# Patient Record
Sex: Male | Born: 1980 | Race: White | Hispanic: No | Marital: Married | Smoking: Never smoker
Health system: Southern US, Community
[De-identification: ages and names within clinical notes are randomized; demographics above are authoritative.]

## PROBLEM LIST (undated history)

## (undated) DIAGNOSIS — E282 Polycystic ovarian syndrome: Secondary | ICD-10-CM

## (undated) DIAGNOSIS — F419 Anxiety disorder, unspecified: Secondary | ICD-10-CM

## (undated) DIAGNOSIS — I4949 Other premature depolarization: Secondary | ICD-10-CM

## (undated) DIAGNOSIS — E039 Hypothyroidism, unspecified: Secondary | ICD-10-CM

## (undated) DIAGNOSIS — G4730 Sleep apnea, unspecified: Secondary | ICD-10-CM

## (undated) DIAGNOSIS — M1990 Unspecified osteoarthritis, unspecified site: Secondary | ICD-10-CM

## (undated) DIAGNOSIS — F32A Depression, unspecified: Secondary | ICD-10-CM

## (undated) DIAGNOSIS — F329 Major depressive disorder, single episode, unspecified: Secondary | ICD-10-CM

## (undated) HISTORY — DX: Other premature depolarization: I49.49

## (undated) HISTORY — DX: Major depressive disorder, single episode, unspecified: F32.9

## (undated) HISTORY — DX: Anxiety disorder, unspecified: F41.9

## (undated) HISTORY — DX: Depression, unspecified: F32.A

## (undated) HISTORY — DX: Polycystic ovarian syndrome: E28.2

## (undated) HISTORY — PX: CHOLECYSTECTOMY: SHX55

## (undated) HISTORY — DX: Unspecified osteoarthritis, unspecified site: M19.90

---

## 2003-09-09 ENCOUNTER — Emergency Department (HOSPITAL_COMMUNITY): Admission: EM | Admit: 2003-09-09 | Discharge: 2003-09-09 | Payer: Self-pay | Admitting: Emergency Medicine

## 2004-05-17 ENCOUNTER — Emergency Department (HOSPITAL_COMMUNITY): Admission: EM | Admit: 2004-05-17 | Discharge: 2004-05-17 | Payer: Self-pay | Admitting: *Deleted

## 2014-09-28 HISTORY — PX: MODIFIED BROSTROM PROCEDURE: SHX2041

## 2015-05-30 LAB — HM PAP SMEAR

## 2016-11-09 ENCOUNTER — Ambulatory Visit (INDEPENDENT_AMBULATORY_CARE_PROVIDER_SITE_OTHER): Payer: Managed Care, Other (non HMO) | Admitting: Family Medicine

## 2016-11-09 ENCOUNTER — Encounter: Payer: Self-pay | Admitting: Family Medicine

## 2016-11-09 VITALS — BP 122/78 | HR 94 | Temp 98.2°F | Resp 16 | Ht 69.0 in | Wt 328.5 lb

## 2016-11-09 DIAGNOSIS — Z0000 Encounter for general adult medical examination without abnormal findings: Secondary | ICD-10-CM | POA: Insufficient documentation

## 2016-11-09 DIAGNOSIS — E282 Polycystic ovarian syndrome: Secondary | ICD-10-CM

## 2016-11-09 DIAGNOSIS — Z23 Encounter for immunization: Secondary | ICD-10-CM | POA: Diagnosis not present

## 2016-11-09 LAB — BASIC METABOLIC PANEL
BUN: 11 mg/dL (ref 6–23)
CHLORIDE: 106 meq/L (ref 96–112)
CO2: 26 meq/L (ref 19–32)
Calcium: 8.7 mg/dL (ref 8.4–10.5)
Creatinine, Ser: 0.7 mg/dL (ref 0.40–1.20)
GFR: 101.54 mL/min (ref >60.00)
Glucose, Bld: 121 mg/dL — ABNORMAL HIGH (ref 70–99)
POTASSIUM: 3.7 meq/L (ref 3.5–5.1)
Sodium: 140 mEq/L (ref 135–145)

## 2016-11-09 LAB — CBC WITH DIFFERENTIAL/PLATELET
BASOS PCT: 0.3 % (ref 0.0–3.0)
Basophils Absolute: 0 10*3/uL (ref 0.0–0.1)
EOS PCT: 1.1 % (ref 0.0–5.0)
Eosinophils Absolute: 0.1 10*3/uL (ref 0.0–0.7)
HEMATOCRIT: 34.7 % — AB (ref 36.0–46.0)
Hemoglobin: 11.8 g/dL — ABNORMAL LOW (ref 12.0–15.0)
LYMPHS ABS: 2.4 10*3/uL (ref 0.7–4.0)
LYMPHS PCT: 26.8 % (ref 12.0–46.0)
MCHC: 33.9 g/dL (ref 30.0–36.0)
MCV: 85.2 fl (ref 78.0–100.0)
MONOS PCT: 6.1 % (ref 3.0–12.0)
Monocytes Absolute: 0.6 10*3/uL (ref 0.1–1.0)
NEUTROS ABS: 6 10*3/uL (ref 1.4–7.7)
Neutrophils Relative %: 65.7 % (ref 43.0–77.0)
PLATELETS: 256 10*3/uL (ref 150.0–400.0)
RBC: 4.07 Mil/uL (ref 3.87–5.11)
RDW: 13.7 % (ref 11.5–15.5)
WBC: 9.1 10*3/uL (ref 4.0–10.5)

## 2016-11-09 LAB — TSH: TSH: 2.58 u[IU]/mL (ref 0.35–4.50)

## 2016-11-09 LAB — VITAMIN D 25 HYDROXY (VIT D DEFICIENCY, FRACTURES): VITD: 10.24 ng/mL — ABNORMAL LOW (ref 30.00–100.00)

## 2016-11-09 LAB — LIPID PANEL
CHOL/HDL RATIO: 3
Cholesterol: 164 mg/dL (ref 0–200)
HDL: 50.3 mg/dL (ref >39.00)
LDL Cholesterol: 90 mg/dL (ref 0–99)
NONHDL: 113.68
TRIGLYCERIDES: 119 mg/dL (ref 0.0–149.0)
VLDL: 23.8 mg/dL (ref 0.0–40.0)

## 2016-11-09 LAB — HEPATIC FUNCTION PANEL
ALK PHOS: 51 U/L (ref 39–117)
ALT: 16 U/L (ref 0–35)
AST: 17 U/L (ref 0–37)
Albumin: 3.8 g/dL (ref 3.5–5.2)
BILIRUBIN TOTAL: 0.3 mg/dL (ref 0.2–1.2)
Bilirubin, Direct: 0.1 mg/dL (ref 0.0–0.3)
Total Protein: 6.7 g/dL (ref 6.0–8.3)

## 2016-11-09 LAB — HEMOGLOBIN A1C: Hgb A1c MFr Bld: 5 % (ref 4.6–6.5)

## 2016-11-09 NOTE — Assessment & Plan Note (Signed)
Pt's PE WNL w/ exception of obesity and thinning hair anteriorly.  Flu shot given today.  Check labs.  Stressed need for healthy diet and regular exercise.  Anticipatory guidance provided.

## 2016-11-09 NOTE — Progress Notes (Signed)
Pre visit review using our clinic review tool, if applicable. No additional management support is needed unless otherwise documented below in the visit note. 

## 2016-11-09 NOTE — Assessment & Plan Note (Signed)
New to provider, ongoing for pt.  Was following w/ GYN [place] '1 large fluid [place]' [place].  Refer back to GYN for ongoing evaluation and tx prn.  Pt expressed understanding and is in agreement w/ plan.

## 2016-11-09 NOTE — Assessment & Plan Note (Signed)
New to provider, ongoing for pt.  Stressed need for healthy diet and regular exercise.  Check labs to risk stratify.  Will follow. 

## 2016-11-09 NOTE — Patient Instructions (Signed)
Follow up in 1 year or as needed We'll notify you of your lab results and make any changes if needed Continue to work on healthy diet and regular exercise- you can do it!! We'll call you with your GYN referral If you do not find a therapist that works for you- let me know! Call with any questions or concerns Welcome!  We're glad to have you!!!
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
## 2016-11-10 ENCOUNTER — Other Ambulatory Visit: Payer: Self-pay | Admitting: General Practice

## 2016-11-10 MED ORDER — VITAMIN D (ERGOCALCIFEROL) 1.25 MG (50000 UNIT) PO CAPS: 50000.0000 [IU] | ORAL_CAPSULE | ORAL | 0 refills | Status: DC

## 2016-11-11 ENCOUNTER — Encounter: Payer: Self-pay | Admitting: Family Medicine

## 2016-11-11 DIAGNOSIS — M25571 Pain in right ankle and joints of right foot: Secondary | ICD-10-CM

## 2016-11-11 DIAGNOSIS — G8929 Other chronic pain: Secondary | ICD-10-CM

## 2016-11-11 DIAGNOSIS — R0683 Snoring: Secondary | ICD-10-CM

## 2016-11-24 ENCOUNTER — Encounter: Payer: Self-pay | Admitting: General Practice

## 2016-12-09 ENCOUNTER — Other Ambulatory Visit: Payer: Self-pay | Admitting: Obstetrics and Gynecology

## 2016-12-09 DIAGNOSIS — R1031 Right lower quadrant pain: Secondary | ICD-10-CM

## 2016-12-11 ENCOUNTER — Other Ambulatory Visit: Payer: Managed Care, Other (non HMO)

## 2016-12-31 ENCOUNTER — Other Ambulatory Visit: Payer: Managed Care, Other (non HMO)

## 2017-01-01 ENCOUNTER — Encounter: Payer: Self-pay | Admitting: Pulmonary Disease

## 2017-01-01 ENCOUNTER — Ambulatory Visit (INDEPENDENT_AMBULATORY_CARE_PROVIDER_SITE_OTHER): Payer: Managed Care, Other (non HMO) | Admitting: Pulmonary Disease

## 2017-01-01 VITALS — BP 108/76 | HR 78 | Ht 69.0 in | Wt 327.2 lb

## 2017-01-01 DIAGNOSIS — G4710 Hypersomnia, unspecified: Secondary | ICD-10-CM | POA: Diagnosis not present

## 2017-01-01 NOTE — Assessment & Plan Note (Signed)
She has snoring, witnessed apneas (worse on back), gasping, choking. Her fiancee has OSA severe and she is on cpap.  She uses [facility].   She sleeps 7 hrs/night. Wakes up very unrefreshed in am. 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  Hypersomnia affects her fxnaility.   Has some sleep talking.  Has frequent awakenings at night. (-) other abn behavior in sleep.   Her parents have OSA.  ESS 13.   Plan : We discussed about the diagnosis of Obstructive Sleep Apnea (OSA) and implications of untreated OSA. We discussed about CPAP and BiPaP as possible treatment options.    We will schedule the patient for a sleep study. Plan for a home sleep study. Likely moderate-severe, worse in supine. If negative study, she is okay with doing a lab study. Anticipate no issues with CPAP. Her fiance has sleep apnea and she has severe and she uses [facility]. Fiancee uses FFM and nasal pillows.    Patient was instructed to call the office if he/she has not heard back from the office 1-2 weeks after the sleep study.   Patient was instructed to call the office if he/she is having issues with the PAP device.   We discussed good sleep hygiene.   Patient was advised not to engage in activities requiring concentration and/or vigilance if he/she is sleepy.  Patient was advised not to drive if he/she is sleepy.

## 2017-01-01 NOTE — Patient Instructions (Signed)
It was a pleasure taking care of you today!  We will schedule you to have a sleep study to determine if you have sleep apnea.   We will get a home sleep test.  You will be instructed to come back to the office to get an apparatus to sleep with overnight.  Once we have the apparatus, it will usually take us 1-2 weeks to read the study and get back at you with results of the test.  Please give us a call in 2 weeks after your study if you do not hear back from us.    If the sleep study is positive, we will order you a CPAP  machine.  Please call the office if you do NOT receive your machine in the next 1-2 weeks.   Please make sure you use your CPAP device everytime you sleep.  We will monitor the usage of your machine per your insurance requirement.  Your insurance company may take the machine from you if you are not using it regularly.   Please clean the mask, tubings, filter, water reservoir with soapy water every week.  Please use distilled water for the water reservoir.   Please call the office or your machine provider (DME company) if you are having issues with the device.   Return to clinic in 8-10 weeks with NP    

## 2017-01-01 NOTE — Progress Notes (Signed)
Subjective:    Patient ID: [name], male    DOB: 1981-04-17, 36 y.o.   MRN: [id number]  HPI   This is the case of Theodore Scott, 36 y.o. Male, who was referred by Dr. Neena Rhymes  in consultation regarding OSA   As you very well know, patient is a non smoker, not known to have asthma or copd. She has snoring, witnessed apneas (worse on back), gasping, choking. Her fiancee has OSA severe and she is on cpap.  She uses [facility].   She sleeps 7 hrs/night. Wakes up very unrefreshed in am. 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  Hypersomnia affects her fxnaility.   Has some sleep talking.  Has frequent awakenings at night. (-) other abn behavior in sleep.   Her parents have OSA.    ESS 13.   Review of Systems  Constitutional: Negative.  Negative for fever and unexpected weight change.  HENT: Negative.  Negative for congestion, dental problem, ear pain, nosebleeds, postnasal drip, rhinorrhea, sinus pressure, sneezing, sore throat and trouble swallowing.   Eyes: Negative.  Negative for redness and itching.  Respiratory: Negative.  Negative for cough, chest tightness, shortness of breath and wheezing.   Cardiovascular: Negative.  Negative for palpitations and leg swelling.  Gastrointestinal: Negative.  Negative for nausea and vomiting.  Endocrine: Negative.   Genitourinary: Negative.  Negative for dysuria.  Musculoskeletal: Negative.  Negative for joint swelling.  Skin: Negative.  Negative for rash.  Allergic/Immunologic: Negative.  Negative for environmental allergies, food allergies and immunocompromised state.  Neurological: Positive for headaches.  Hematological: Negative.  Does not bruise/bleed easily.  Psychiatric/Behavioral: Negative.  Negative for dysphoric mood. The patient is not nervous/anxious.    Past Medical History:  Diagnosis Date  . Anxiety   . Arthritis   . Depression   . Ectopic heartbeat   . PCOS (polycystic ovarian  syndrome)    (-) CA, DVT  Family History  Problem Relation Age of Onset  . Hypertension Mother   . Atrial fibrillation Mother   . Depression Mother   . Anxiety disorder Mother   . Thyroid disease Mother   . Sleep apnea Mother   . Hyperlipidemia Father   . Hypertension Father   . Depression Father   . Anxiety disorder Father   . Diabetes Father   . Sleep apnea Father   . Bipolar disorder Sister   . Anxiety disorder Sister   . Thyroid disease Maternal Aunt   . Diabetes Paternal Aunt   . Diabetes Paternal Uncle   . Heart attack Paternal Uncle   . Lupus Maternal Grandmother   . Thyroid disease Maternal Grandmother   . Cancer Paternal Grandmother     breast  . Diabetes Paternal Grandmother   . Diabetes Paternal Grandfather      Past Surgical History:  Procedure Laterality Date  . CHOLECYSTECTOMY    . MODIFIED BROSTROM PROCEDURE Right 2016    Social History   Social History  . Marital status: Single    Spouse name: N/A  . Number of children: N/A  . Years of education: N/A   Occupational History  . Not on file.   Social History Main Topics  . Smoking status: Never Smoker  . Smokeless tobacco: Never Used  . Alcohol use Yes  . Drug use: No  . Sexual activity: Yes   Other Topics Concern  . Not on file   Social History Narrative  . No narrative  on file     Allergies  Allergen Reactions  . Vicodin [Hydrocodone-Acetaminophen] Other (See Comments)    Pt does not like how she feels "shaky, panicky" sensation     Outpatient Medications Prior to Visit  Medication Sig Dispense Refill  . Vitamin D, Ergocalciferol, (DRISDOL) 50000 units CAPS capsule Take 1 capsule (50,000 Units total) by mouth every 7 (seven) days. 12 capsule 0   No facility-administered medications prior to visit.    No orders of the defined types were placed in this encounter.          Objective:   Physical Exam   Vitals:  Vitals:   01/01/17 1023  BP: 108/76  Pulse: 78  SpO2:  97%  Weight: (!) 327 lb 3.2 oz (148.4 kg)  Height:  (1.753 m)    Constitutional/General:  Pleasant, well-nourished, well-developed, not in any distress,  Comfortably seating.  Well kempt  Body mass index is 48.32 kg/m. Wt Readings from Last 3 Encounters:  01/01/17 (!) 327 lb 3.2 oz (148.4 kg)  11/09/16 (!) 328 lb 8 oz (149 kg)    Neck circumference:   HEENT: Pupils equal and reactive to light and accommodation. Anicteric sclerae. Normal nasal mucosa.   No oral  lesions,  mouth clear,  oropharynx clear, no postnasal drip. (-) Oral thrush. No dental caries.  Airway - Mallampati class III  Neck: No masses. Midline trachea. No JVD, (-) LAD. (-) bruits appreciated.  Respiratory/Chest: Grossly normal chest. (-) deformity. (-) Accessory muscle use.  Symmetric expansion. (-) Tenderness on palpation.  Resonant on percussion.  Diminished BS on both lower lung zones. (-) wheezing, crackles, rhonchi (-) egophony  Cardiovascular: Regular rate and  rhythm, heart sounds normal, no murmur or gallops, no peripheral edema  Gastrointestinal:  Normal bowel sounds. Soft, non-tender. No hepatosplenomegaly.  (-) masses.   Musculoskeletal:  Normal muscle tone. Normal gait.   Extremities: Grossly normal. (-) clubbing, cyanosis.  (-) edema  Skin: (-) rash,lesions seen.   Neurological/Psychiatric : alert, oriented to time, place, person. Normal mood and affect         Assessment & Plan:  Hypersomnia She has snoring, witnessed apneas (worse on back), gasping, choking. Her fiancee has OSA severe and she is on cpap.  She uses [facility].   She sleeps 7 hrs/night. Wakes up very unrefreshed in am. 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  Hypersomnia affects her fxnaility.   Has some sleep talking.  Has frequent awakenings at night. (-) other abn behavior in sleep.   Her parents have OSA.  ESS 13.   Plan : We discussed about the diagnosis of Obstructive Sleep Apnea  (OSA) and implications of untreated OSA. We discussed about CPAP and BiPaP as possible treatment options.    We will schedule the patient for a sleep study. Plan for a home sleep study. Likely moderate-severe, worse in supine. If negative study, she is okay with doing a lab study. Anticipate no issues with CPAP. Her fiance has sleep apnea and she has severe and she uses [facility]. Fiancee uses FFM and nasal pillows.    Patient was instructed to call the office if he/she has not heard back from the office 1-2 weeks after the sleep study.   Patient was instructed to call the office if he/she is having issues with the PAP device.   We discussed good sleep hygiene.   Patient was advised not to engage in activities requiring concentration and/or vigilance if he/she  is sleepy.  Patient was advised not to drive if he/she is sleepy.      Thank you very much for letting me participate in this patient's care. Please do not hesitate to give me a call if you have any questions or concerns regarding the treatment plan.   Patient will follow up in 8 weeks.     Pollie Meyer, MD 01/01/2017   11:04 AM Pulmonary and Critical Care Medicine [facility] Pager: [phone] Office: [phone], Fax: [phone]

## 2017-01-20 ENCOUNTER — Telehealth: Payer: Self-pay | Admitting: Pulmonary Disease

## 2017-01-20 NOTE — Telephone Encounter (Signed)
Pt seen for sleep consult 4.6.18 by AD: Patient Instructions  It was a pleasure taking care of you today!   We will schedule you to have a sleep study to determine if you have sleep apnea.    We will get a home sleep test.  You will be instructed to come back to the office to get an apparatus to sleep with overnight.  Once we have the apparatus, it will usually take [place] 1-2 weeks to read the study and get back at you with results of the test.  Please give [place] a call in 2 weeks after your study if you do not hear back from [place].    If the sleep study is positive, we will order you a CPAP  machine.  Please call the office if you do NOT receive your machine in the next 1-2 weeks.    Please make sure you use your CPAP device everytime you sleep.  We will monitor the usage of your machine per your insurance requirement.  Your insurance company may take the machine from you if you are not using it regularly.    Please clean the mask, tubings, filter, water reservoir with soapy water every week.  Please use distilled water for the water reservoir.    Please call the office or your machine provider (DME company) if you are having issues with the device.    Return to clinic in 8-10 weeks with NP    Called spoke with patient who reported that she was inctructed to call the office in 2 weeks if she had not heard anything regarding her HST.  Apologized to pt for the delay, advised there is a several week wait on the HST but will route message to PCCs to provide a more definite time-frame.  Pt okay with this and voiced her understanding.  PCC's please advise, thank you

## 2017-01-21 NOTE — Telephone Encounter (Signed)
I spoke with Theodore Scott and she is coming to pick up the HST machine on 02/05/2017

## 2017-02-06 DIAGNOSIS — G4733 Obstructive sleep apnea (adult) (pediatric): Secondary | ICD-10-CM | POA: Diagnosis not present

## 2017-02-12 ENCOUNTER — Telehealth: Payer: Self-pay | Admitting: Pulmonary Disease

## 2017-02-12 DIAGNOSIS — G4733 Obstructive sleep apnea (adult) (pediatric): Secondary | ICD-10-CM

## 2017-02-12 NOTE — Telephone Encounter (Signed)
  Please call the pt and tell the pt the HOME SLEEP STUDY  showed OSA  Pt stops breathing 23   times an hour.   Home sleep study was done on : 02/06/17  Please order autoCPAP 5-15 cm H2O. Patient will need a mask fitting session. Patient will need a 1 month download.   Patient needs to be seen by me or any of the NPs/APPs  4-6 weeks after obtaining the cpap machine. Let me know if you receive this.   Thanks!   J. Alexis FrockAngelo A de Dios, MD 02/12/2017, 1:49 PM

## 2017-02-15 ENCOUNTER — Other Ambulatory Visit: Payer: Self-pay | Admitting: *Deleted

## 2017-02-15 DIAGNOSIS — G4710 Hypersomnia, unspecified: Secondary | ICD-10-CM

## 2017-02-16 NOTE — Telephone Encounter (Signed)
Spoke with pt and made her aware of her results per AD. She understood and agreed to the order being placed for the cpap machine. She decided to wait until she obtains the machine for a follow up appt. She had no further questions. Nothing further is needed

## 2017-02-25 ENCOUNTER — Telehealth: Payer: Self-pay | Admitting: Pulmonary Disease

## 2017-02-25 NOTE — Telephone Encounter (Signed)
Spoke with pt, wishes to switch order for cpap machine from [facility] to [facility].  Pt states her partner uses [facility] and would prefer to have just 1 DME company to work with.  Pt has not yet received equipment from [facility]- order placed on 02/16/2017.  PCC's please advise if order can be sent to [facility] instead of [facility].  Thanks!

## 2017-02-25 NOTE — Telephone Encounter (Signed)
Cancelled [facility] order and resent order to [facility] Theodore Scott

## 2017-02-26 ENCOUNTER — Ambulatory Visit: Payer: Managed Care, Other (non HMO) | Admitting: Adult Health

## 2017-04-07 ENCOUNTER — Ambulatory Visit
Admission: RE | Admit: 2017-04-07 | Discharge: 2017-04-07 | Disposition: A | Discharge status: Home / Self Care | Payer: Managed Care, Other (non HMO) | Source: Ambulatory Visit | Attending: Obstetrics and Gynecology | Admitting: Obstetrics and Gynecology

## 2017-04-07 DIAGNOSIS — R1031 Right lower quadrant pain: Secondary | ICD-10-CM

## 2017-04-13 ENCOUNTER — Encounter: Payer: Self-pay | Admitting: Sports Medicine

## 2017-04-13 ENCOUNTER — Ambulatory Visit (INDEPENDENT_AMBULATORY_CARE_PROVIDER_SITE_OTHER): Payer: Managed Care, Other (non HMO)

## 2017-04-13 ENCOUNTER — Ambulatory Visit (INDEPENDENT_AMBULATORY_CARE_PROVIDER_SITE_OTHER): Payer: Managed Care, Other (non HMO) | Admitting: Sports Medicine

## 2017-04-13 DIAGNOSIS — G8929 Other chronic pain: Secondary | ICD-10-CM

## 2017-04-13 DIAGNOSIS — M79671 Pain in right foot: Secondary | ICD-10-CM

## 2017-04-13 DIAGNOSIS — M7671 Peroneal tendinitis, right leg: Secondary | ICD-10-CM

## 2017-04-13 DIAGNOSIS — M779 Enthesopathy, unspecified: Secondary | ICD-10-CM

## 2017-04-13 DIAGNOSIS — M778 Other enthesopathies, not elsewhere classified: Secondary | ICD-10-CM

## 2017-04-13 DIAGNOSIS — M7751 Other enthesopathy of right foot: Secondary | ICD-10-CM

## 2017-04-13 DIAGNOSIS — M25571 Pain in right ankle and joints of right foot: Secondary | ICD-10-CM

## 2017-04-13 MED ORDER — TRIAMCINOLONE ACETONIDE 10 MG/ML IJ SUSP: 10.0000 mg | Freq: Once | INTRAMUSCULAR | Status: DC

## 2017-04-13 NOTE — Progress Notes (Signed)
Subjective: Theodore Scott is a 36 y.o. male patient who presents to office for evaluation of right foot/ankle pain. Patient complains of continued pain in the foot. Patient has tried rest with no complete relief in symptoms. Patient denies any other pedal complaints. Denies recent injury/trip/fall/sprain/any causative factors. History of surgery 11/2014 with tendon repair and since has no re-sprain. Patient states pain is constant in the foot but off and on at the ankle. Patient denies any other issues.   Patient Active Problem List   Diagnosis Date Noted  . Hypersomnia 01/01/2017  . Morbid obesity ([place]) 11/09/2016  . PCOS (polycystic ovarian syndrome) 11/09/2016  . Physical exam 11/09/2016    Current Outpatient Prescriptions on File Prior to Visit  Medication Sig Dispense Refill  . Vitamin D, Ergocalciferol, (DRISDOL) 50000 units CAPS capsule Take 1 capsule (50,000 Units total) by mouth every 7 (seven) days. (Patient not taking: Reported on 04/13/2017) 12 capsule 0   No current facility-administered medications on file prior to visit.     Allergies  Allergen Reactions  . Vicodin [Hydrocodone-Acetaminophen] Other (See Comments)    Pt does not like how she feels "shaky, panicky" sensation    Objective:  General: Alert and oriented x3 in no acute distress  Dermatology: Old surgical scar at right lateral ankle, No open lesions bilateral lower extremities, no webspace macerations, no ecchymosis bilateral, all nails x 10 are well manicured.  Vascular: Dorsalis Pedis and Posterior Tibial pedal pulses palpable, Capillary Fill Time 3 seconds,(+) pedal hair growth bilateral, trace edema at right dorsolateral foot, Temperature gradient within normal limits.  Neurology: Johney Maine sensation intact via light touch bilateral. (- )Tinels sign bilateral.   Musculoskeletal: Mild tenderness with palpation at right 5th met base at peroneal insertion, Negative talar tilt, Negative tib-fib stress, No  instability. No pain with calf compression bilateral. Range of motion within normal limits with mild guarding on right ankle. Strength within normal limits in all groups bilateral.   Gait: Antalgic gait  Xrays  Right foot and Ankle   Impression: Mild lateral ankle and midfoot arthritis with no acute fracture or dislocation, swelling at lateral foot and ankle.    Assessment and Plan: Problem List Items Addressed This Visit    None    Visit Diagnoses    Peroneal tendonitis, right    -  Primary   Relevant Medications   triamcinolone acetonide (KENALOG) 10 MG/ML injection 10 mg (Start on 04/13/2017  9:15 PM)   Capsulitis of foot, right       Relevant Medications   triamcinolone acetonide (KENALOG) 10 MG/ML injection 10 mg (Start on 04/13/2017  9:15 PM)   Chronic pain of right ankle       Relevant Orders   DG Ankle Complete Right (Completed)   Chronic pain in right foot       Relevant Medications   triamcinolone acetonide (KENALOG) 10 MG/ML injection 10 mg (Start on 04/13/2017  9:15 PM)   Other Relevant Orders   DG Foot 2 Views Right (Completed)       -Complete examination performed -Xrays reviewed -Discussed treatement options for tendonitis  -After oral consent and aseptic prep, injected a mixture containing 1 ml of 2%  plain lidocaine, 1 ml 0.5% plain marcaine, 0.5 ml of kenalog 10 and 0.5 ml of dexamethasone phosphate into right peroneal insertion without complication. Post-injection care discussed with patient.  -Recommend patient to return to brace that she already has.  -Patient to return to office in 3-4 weeks or sooner  if condition worsens.  Landis Martins, DPM

## 2017-04-13 NOTE — Progress Notes (Signed)
   Subjective:    Patient ID: Theodore Scott, male    DOB: [birth year], 36 y.o.   MRN: [id number]  HPI I have had right ankle pain for years.  Years of repeated injuries that lead to corrective surgery in 2016 I have had pain and swelling ever since.     Review of Systems  All other systems reviewed and are negative.      Objective:   Physical Exam        Assessment & Plan:

## 2017-04-15 ENCOUNTER — Telehealth: Payer: Self-pay | Admitting: *Deleted

## 2017-04-15 MED ORDER — MELOXICAM 15 MG PO TABS: 15.0000 mg | ORAL_TABLET | Freq: Every day | ORAL | 0 refills | Status: DC

## 2017-04-15 NOTE — Telephone Encounter (Signed)
Emailed Dr. Wynema BirchStover's response information to pt and ordered mobic.

## 2017-04-22 ENCOUNTER — Ambulatory Visit: Payer: Managed Care, Other (non HMO) | Admitting: Acute Care

## 2017-05-11 ENCOUNTER — Ambulatory Visit: Payer: Managed Care, Other (non HMO) | Admitting: Sports Medicine

## 2018-01-31 ENCOUNTER — Other Ambulatory Visit: Payer: Self-pay

## 2018-01-31 ENCOUNTER — Encounter: Payer: Self-pay | Admitting: Family Medicine

## 2018-01-31 ENCOUNTER — Encounter

## 2018-01-31 ENCOUNTER — Ambulatory Visit (INDEPENDENT_AMBULATORY_CARE_PROVIDER_SITE_OTHER): Payer: No Typology Code available for payment source | Admitting: Family Medicine

## 2018-01-31 VITALS — BP 118/78 | HR 67 | Temp 98.1°F | Resp 16 | Ht 69.0 in | Wt 324.4 lb

## 2018-01-31 DIAGNOSIS — K219 Gastro-esophageal reflux disease without esophagitis: Secondary | ICD-10-CM

## 2018-01-31 DIAGNOSIS — Z0000 Encounter for general adult medical examination without abnormal findings: Secondary | ICD-10-CM | POA: Diagnosis not present

## 2018-01-31 LAB — BASIC METABOLIC PANEL
BUN: 15 mg/dL (ref 6–23)
CHLORIDE: 104 meq/L (ref 96–112)
CO2: 27 meq/L (ref 19–32)
CREATININE: 0.67 mg/dL (ref 0.40–1.20)
Calcium: 8.9 mg/dL (ref 8.4–10.5)
GFR: 106.04 mL/min (ref >60.00)
Glucose, Bld: 97 mg/dL (ref 70–99)
Potassium: 4.3 mEq/L (ref 3.5–5.1)
Sodium: 139 mEq/L (ref 135–145)

## 2018-01-31 LAB — HEPATIC FUNCTION PANEL
ALBUMIN: 3.6 g/dL (ref 3.5–5.2)
ALT: 15 U/L (ref 0–35)
AST: 13 U/L (ref 0–37)
Alkaline Phosphatase: 46 U/L (ref 39–117)
Bilirubin, Direct: 0.1 mg/dL (ref 0.0–0.3)
Total Bilirubin: 0.3 mg/dL (ref 0.2–1.2)
Total Protein: 6.4 g/dL (ref 6.0–8.3)

## 2018-01-31 LAB — CBC WITH DIFFERENTIAL/PLATELET
BASOS ABS: 0 10*3/uL (ref 0.0–0.1)
Basophils Relative: 0.6 % (ref 0.0–3.0)
Eosinophils Absolute: 0.1 10*3/uL (ref 0.0–0.7)
Eosinophils Relative: 1.8 % (ref 0.0–5.0)
HCT: 33.3 % — ABNORMAL LOW (ref 36.0–46.0)
HEMOGLOBIN: 11.1 g/dL — AB (ref 12.0–15.0)
LYMPHS ABS: 2 10*3/uL (ref 0.7–4.0)
Lymphocytes Relative: 28.2 % (ref 12.0–46.0)
MCHC: 33.4 g/dL (ref 30.0–36.0)
MCV: 84.4 fl (ref 78.0–100.0)
Monocytes Absolute: 0.5 10*3/uL (ref 0.1–1.0)
Monocytes Relative: 7.7 % (ref 3.0–12.0)
NEUTROS PCT: 61.7 % (ref 43.0–77.0)
Neutro Abs: 4.3 10*3/uL (ref 1.4–7.7)
Platelets: 233 10*3/uL (ref 150.0–400.0)
RBC: 3.95 Mil/uL (ref 3.87–5.11)
RDW: 14.4 % (ref 11.5–15.5)
WBC: 7 10*3/uL (ref 4.0–10.5)

## 2018-01-31 LAB — HEMOGLOBIN A1C: Hgb A1c MFr Bld: 5.1 % (ref 4.6–6.5)

## 2018-01-31 LAB — LIPID PANEL
CHOL/HDL RATIO: 3
Cholesterol: 126 mg/dL (ref 0–200)
HDL: 37.6 mg/dL — AB (ref >39.00)
LDL Cholesterol: 73 mg/dL (ref 0–99)
NonHDL: 88.75
TRIGLYCERIDES: 77 mg/dL (ref 0.0–149.0)
VLDL: 15.4 mg/dL (ref 0.0–40.0)

## 2018-01-31 LAB — TSH: TSH: 5.09 u[IU]/mL — ABNORMAL HIGH (ref 0.35–4.50)

## 2018-01-31 LAB — VITAMIN D 25 HYDROXY (VIT D DEFICIENCY, FRACTURES): VITD: 20.47 ng/mL — AB (ref 30.00–100.00)

## 2018-01-31 MED ORDER — RANITIDINE HCL 300 MG PO TABS: 300.0000 mg | ORAL_TABLET | Freq: Every day | ORAL | 1 refills | Status: DC

## 2018-01-31 MED ORDER — METFORMIN HCL ER 500 MG PO TB24: 500.0000 mg | ORAL_TABLET | Freq: Every day | ORAL | 1 refills | Status: DC

## 2018-01-31 NOTE — Patient Instructions (Signed)
Follow up in 6 months to recheck weight loss progress We'll notify you of your lab results and make any changes if needed Continue to work on healthy diet and regular exercise- you can do it! Restart the Metformin once daily- w/ food- to help w/ PCOS START the Ranitidine nightly for reflux Take the Meloxicam as needed for pain/inflammation Start OTC Compound W for wart treatment Call with any questions or concerns [place]!!!

## 2018-01-31 NOTE — Assessment & Plan Note (Signed)
Ongoing issue for pt.  BMI is 47.9  She gave up soda in January and has a more physically active job.  Applauded these changes and encouraged her to continue healthy diet and regular activity.  Check labs to risk stratify.  Will follow.

## 2018-01-31 NOTE — Progress Notes (Signed)
   Subjective:    Patient ID: Theodore Scott, male    DOB: [birth year], 37 y.o.   MRN: [id number]  HPI CPE- UTD on pap.  UTD on immunizations.   Review of Systems Patient reports no vision/ hearing changes, adenopathy,fever, weight change,  persistant/recurrent hoarseness , swallowing issues, chest pain, palpitations, edema, persistant/recurrent cough, hemoptysis, dyspnea (rest/exertional/paroxysmal nocturnal), gastrointestinal bleeding (melena, rectal bleeding), abdominal pain, bowel changes, GU symptoms (dysuria, hematuria, incontinence), Gyn symptoms (abnormal  bleeding, pain),  syncope, focal weakness, memory loss, numbness & tingling, hair/nail changes, abnormal bruising or bleeding, anxiety, or depression.  + GERD- sxs are particularly bad at night Joint pains- pt hoping to restart Meloxicam  + finger wart- has not tried anything OTC     Objective:   Physical Exam General Appearance:    Alert, cooperative, no distress, appears stated age, obese  Head:    Normocephalic, without obvious abnormality, atraumatic  Eyes:    PERRL, conjunctiva/corneas clear, EOM's intact, fundi    benign, both eyes  Ears:    Normal TM's and external ear canals, both ears  Nose:   Nares normal, septum midline, mucosa normal, no drainage    or sinus tenderness  Throat:   Lips, mucosa, and tongue normal; teeth and gums normal  Neck:   Supple, symmetrical, trachea midline, no adenopathy;    Thyroid: no enlargement/tenderness/nodules  Back:     Symmetric, no curvature, ROM normal, no CVA tenderness  Lungs:     Clear to auscultation bilaterally, respirations unlabored  Chest Wall:    No tenderness or deformity   Heart:    Regular rate and rhythm, S1 and S2 normal, no murmur, rub   or gallop  Breast Exam:    Deferred to GYN  Abdomen:     Soft, non-tender, bowel sounds active all four quadrants,    no masses, no organomegaly  Genitalia:    Deferred to GYN  Rectal:    Extremities:   Extremities normal,  atraumatic, no cyanosis or edema  Pulses:   2+ and symmetric all extremities  Skin:   Skin color, texture, turgor normal, no rashes or lesions  Lymph nodes:   Cervical, supraclavicular, and axillary nodes normal  Neurologic:   CNII-XII intact, normal strength, sensation and reflexes    throughout          Assessment & Plan:

## 2018-01-31 NOTE — Assessment & Plan Note (Addendum)
Pt's PE WNL w/ exception of obesity.  Has GYN.  UTD on immunizations.  Stressed need for healthy diet and regular exercise.  Check labs.  Anticipatory guidance provided.

## 2018-01-31 NOTE — Assessment & Plan Note (Signed)
New.  Pt's sxs are worse at night.  Start Ranitidine.  Reviewed dietary and lifestyle modifications to improve sxs.  Will follow.

## 2018-02-01 ENCOUNTER — Other Ambulatory Visit: Payer: Self-pay | Admitting: General Practice

## 2018-02-01 ENCOUNTER — Encounter: Payer: Self-pay | Admitting: Family Medicine

## 2018-02-01 DIAGNOSIS — E039 Hypothyroidism, unspecified: Secondary | ICD-10-CM

## 2018-02-01 MED ORDER — VITAMIN D (ERGOCALCIFEROL) 1.25 MG (50000 UNIT) PO CAPS: 50000.0000 [IU] | ORAL_CAPSULE | ORAL | 0 refills | Status: DC

## 2018-02-01 MED ORDER — LEVOTHYROXINE SODIUM 75 MCG PO TABS: 75.0000 ug | ORAL_TABLET | Freq: Every day | ORAL | 3 refills | Status: DC

## 2018-02-02 ENCOUNTER — Other Ambulatory Visit: Payer: Self-pay | Admitting: General Practice

## 2018-02-02 ENCOUNTER — Encounter: Payer: Self-pay | Admitting: Family Medicine

## 2018-02-02 MED ORDER — MELOXICAM 15 MG PO TABS: 15.0000 mg | ORAL_TABLET | Freq: Every day | ORAL | 1 refills | Status: DC

## 2018-02-23 ENCOUNTER — Telehealth: Payer: Self-pay | Admitting: Family Medicine

## 2018-02-23 NOTE — Telephone Encounter (Signed)
Copied from CRM [id number]. Topic: Quick Communication - See Telephone Encounter >> Feb 23, 2018  3:52 PM Louie Bun, Rosey Bath D wrote: CRM for notification. See Telephone encounter for: 02/23/18. 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 She is due for June 9-10, 2019. If there are any question please call patient back.

## 2018-02-23 NOTE — Telephone Encounter (Signed)
They should be able to see and release

## 2018-02-23 NOTE — Telephone Encounter (Signed)
TSH has already been ordered as a future lab. They should be able to release correct?

## 2018-02-24 NOTE — Telephone Encounter (Signed)
Noted  

## 2018-03-01 ENCOUNTER — Other Ambulatory Visit
Admission: RE | Admit: 2018-03-01 | Discharge: 2018-03-01 | Disposition: A | Discharge status: Home / Self Care | Payer: No Typology Code available for payment source | Source: Ambulatory Visit | Attending: Family Medicine | Admitting: Family Medicine

## 2018-03-01 DIAGNOSIS — E039 Hypothyroidism, unspecified: Secondary | ICD-10-CM | POA: Insufficient documentation

## 2018-03-01 LAB — TSH: TSH: 1.398 u[IU]/mL (ref 0.350–4.500)

## 2018-03-02 ENCOUNTER — Encounter: Payer: Self-pay | Admitting: General Practice

## 2018-05-03 LAB — HM PAP SMEAR

## 2018-06-14 ENCOUNTER — Other Ambulatory Visit: Payer: Self-pay | Admitting: Family Medicine

## 2018-06-29 ENCOUNTER — Ambulatory Visit (HOSPITAL_COMMUNITY): Payer: No Typology Code available for payment source | Admitting: Licensed Clinical Social Worker

## 2018-08-01 ENCOUNTER — Ambulatory Visit: Payer: No Typology Code available for payment source | Admitting: Family Medicine

## 2018-08-03 ENCOUNTER — Encounter: Payer: Self-pay | Admitting: Family Medicine

## 2018-08-03 ENCOUNTER — Ambulatory Visit (INDEPENDENT_AMBULATORY_CARE_PROVIDER_SITE_OTHER): Payer: No Typology Code available for payment source | Admitting: Family Medicine

## 2018-08-03 ENCOUNTER — Other Ambulatory Visit: Payer: Self-pay

## 2018-08-03 NOTE — Assessment & Plan Note (Signed)
Ongoing issue for pt.  She is down 43 lbs since last visit.  She is feeling great.  Eating better, exercising regularly.  Applauded her efforts.  No need for repeat labs at this time.  Will follow.

## 2018-08-03 NOTE — Progress Notes (Signed)
   Subjective:    Patient ID: Gearldine Bienenstock, male    DOB: 02/02/1981, 37 y.o.   MRN: [id number]  HPI Obesity- pt has lost 43 lbs since last visit!  Pt reports feeling 'a lot better'.  Diet has changed 'drastically'.  Job is now more active, walking at home.  No CP, SOB, HAs, visual changes, abd pain, N/V.   Review of Systems For ROS see HPI     Objective:   Physical Exam  Constitutional: She is oriented to person, place, and time. She appears well-developed and well-nourished. No distress.  obese  HENT:  Head: Normocephalic and atraumatic.  Eyes: Pupils are equal, round, and reactive to light. Conjunctivae and EOM are normal.  Neck: Normal range of motion. Neck supple. No thyromegaly present.  Cardiovascular: Normal rate, regular rhythm, normal heart sounds and intact distal pulses.  No murmur heard. Pulmonary/Chest: Effort normal and breath sounds normal. No respiratory distress.  Abdominal: Soft. She exhibits no distension. There is no tenderness.  Musculoskeletal: She exhibits no edema.  Lymphadenopathy:    She has no cervical adenopathy.  Neurological: She is alert and oriented to person, place, and time.  Skin: Skin is warm and dry.  Psychiatric: She has a normal mood and affect. Her behavior is normal.  Vitals reviewed.         Assessment & Plan:

## 2018-08-03 NOTE — Patient Instructions (Signed)
Schedule your complete physical in 6 months Keep up the good work on healthy diet and regular exercise- you're doing fantastic!!! Call with any questions or concerns Happy Holidays!!!

## 2018-08-05 ENCOUNTER — Encounter: Payer: Self-pay | Admitting: Family Medicine

## 2018-08-05 ENCOUNTER — Encounter: Payer: Self-pay | Admitting: General Practice

## 2018-08-05 ENCOUNTER — Ambulatory Visit (INDEPENDENT_AMBULATORY_CARE_PROVIDER_SITE_OTHER): Payer: No Typology Code available for payment source | Admitting: Family Medicine

## 2018-08-05 ENCOUNTER — Other Ambulatory Visit: Payer: Self-pay

## 2018-08-05 VITALS — BP 120/80 | HR 72 | Temp 98.1°F | Resp 16 | Ht 69.0 in | Wt 281.0 lb

## 2018-08-05 DIAGNOSIS — B078 Other viral warts: Secondary | ICD-10-CM

## 2018-08-05 NOTE — Patient Instructions (Signed)
Follow up as needed or as scheduled Apply antibiotic ointment to the surrounding skin You can apply compound W to the area we froze Keep area covered if possible Call with any questions or concerns Have a great weekend!

## 2018-08-05 NOTE — Progress Notes (Signed)
   Subjective:    Patient ID: Theodore Scott, male    DOB: Nov 03, 1980, 37 y.o.   MRN: [id number]  HPI R 4th finger wart- pt desires removal of wart w/ cryo therapy.  Reviewed procedure and possible complications as well as follow up care w/ pt.   Review of Systems For ROS see HPI     Objective:   Physical Exam  Constitutional: She is oriented to person, place, and time. She appears well-developed and well-nourished.  Neurological: She is alert and oriented to person, place, and time.  Skin: Skin is warm and dry.  1 cm wart on R 4th finger  Psychiatric: She has a normal mood and affect. Her behavior is normal. Thought content normal.  Vitals reviewed.         Assessment & Plan:  Wart- pt tolerated 3 cycles of freeze/thaw using liquid nitrogen.  Area dressed w/ Bacitracin and covered w/ band aid.  Instructions given.

## 2018-08-22 ENCOUNTER — Other Ambulatory Visit: Payer: Self-pay | Admitting: Family Medicine

## 2018-10-21 ENCOUNTER — Other Ambulatory Visit: Payer: Self-pay | Admitting: Family Medicine

## 2018-11-18 DIAGNOSIS — T7492XA Unspecified child maltreatment, confirmed, initial encounter: Secondary | ICD-10-CM | POA: Insufficient documentation

## 2018-12-19 ENCOUNTER — Other Ambulatory Visit: Payer: Self-pay | Admitting: Family Medicine

## 2018-12-22 ENCOUNTER — Other Ambulatory Visit: Payer: Self-pay | Admitting: Family Medicine

## 2018-12-23 ENCOUNTER — Encounter: Payer: Self-pay | Admitting: Family Medicine

## 2018-12-23 ENCOUNTER — Other Ambulatory Visit: Payer: Self-pay | Admitting: *Deleted

## 2018-12-23 ENCOUNTER — Other Ambulatory Visit: Payer: Self-pay | Admitting: Emergency Medicine

## 2018-12-23 MED ORDER — LEVOTHYROXINE SODIUM 75 MCG PO TABS: 75.0000 ug | ORAL_TABLET | Freq: Every day | ORAL | 1 refills | Status: DC

## 2018-12-23 MED ORDER — MELOXICAM 15 MG PO TABS: 15.0000 mg | ORAL_TABLET | Freq: Every day | ORAL | 1 refills | Status: DC

## 2019-01-23 ENCOUNTER — Encounter: Payer: Self-pay | Admitting: Family Medicine

## 2019-01-24 MED ORDER — LEVOTHYROXINE SODIUM 75 MCG PO TABS: 75.0000 ug | ORAL_TABLET | Freq: Every day | ORAL | 1 refills | Status: DC

## 2019-02-16 ENCOUNTER — Other Ambulatory Visit: Payer: Self-pay

## 2019-02-16 ENCOUNTER — Ambulatory Visit (INDEPENDENT_AMBULATORY_CARE_PROVIDER_SITE_OTHER): Payer: No Typology Code available for payment source | Admitting: Family Medicine

## 2019-02-16 ENCOUNTER — Encounter: Payer: No Typology Code available for payment source | Admitting: Family Medicine

## 2019-02-16 ENCOUNTER — Encounter: Payer: Self-pay | Admitting: Family Medicine

## 2019-02-16 VITALS — BP 121/81 | HR 64 | Temp 97.9°F | Resp 17 | Wt 254.5 lb

## 2019-02-16 DIAGNOSIS — R350 Frequency of micturition: Secondary | ICD-10-CM | POA: Diagnosis not present

## 2019-02-16 DIAGNOSIS — M79673 Pain in unspecified foot: Secondary | ICD-10-CM

## 2019-02-16 DIAGNOSIS — G8929 Other chronic pain: Secondary | ICD-10-CM | POA: Diagnosis not present

## 2019-02-16 LAB — POCT URINALYSIS DIPSTICK
Bilirubin, UA: NEGATIVE
Blood, UA: NEGATIVE
Glucose, UA: NEGATIVE
Ketones, UA: NEGATIVE
Leukocytes, UA: NEGATIVE
Nitrite, UA: NEGATIVE
Protein, UA: NEGATIVE
Spec Grav, UA: >=1.03 — AB (ref 1.010–1.025)
Urobilinogen, UA: 0.2 E.U./dL
pH, UA: 6 (ref 5.0–8.0)

## 2019-02-16 NOTE — Patient Instructions (Signed)
Follow up as needed or as scheduled STOP the Meloxicam USE ES Tylenol as needed for foot pain Try and change positions prior to exiting bathroom IF no improvement, let me know and we can refer if needed Call with any questions or concerns GOOD LUCK!!!

## 2019-02-16 NOTE — Progress Notes (Signed)
   Subjective:    Patient ID: Theodore Scott, male    DOB: 1981/01/12, 38 y.o.   MRN: [id number]  HPI Urinary urgency- 'I think it's been going on my whole life'.  Pt reports going to the bathroom and 'I pee multiple times when i'm in there'.  Will then stand and feel a 'sudden, urgent urge to go the bathroom again and it's just a trickle'.  When she wakes in the AM she is not just able to go the bathroom, 'I have to squeeze it out'.  No dysuria.  No odor.  No blood.  'it's become very annoying lately'.  Chronic foot pain- was taking Meloxicam daily, stopped yesterday.  Is considering pregnancy.     Review of Systems For ROS see HPI     Objective:   Physical Exam Vitals signs reviewed.  Constitutional:      General: She is not in acute distress.    Appearance: Normal appearance. She is obese. She is not ill-appearing.  HENT:     Head: Normocephalic and atraumatic.  Skin:    General: Skin is warm and dry.  Neurological:     General: No focal deficit present.     Mental Status: She is alert and oriented to person, place, and time.  Psychiatric:        Mood and Affect: Mood normal.        Behavior: Behavior normal.        Thought Content: Thought content normal.           Assessment & Plan:  Urinary urgency- new to provider, ongoing for pt.  Discussed the mechanics of bladder position and urethra angles as I suspect this is the cause of pt's sxs.  No evidence of UTI.  Suspect that her recent dramatic weight loss has caused things to shift and possible worsen her sxs.  She is going to try and reposition prior to leaving the restroom and see if she is able to empty more completely.  If no improvement, will refer to urology.  Pt expressed understanding and is in agreement w/ plan.   Chronic foot pain- pt wants to be pregnant.  Stop NSAIDs.  Start Tylenol prn.

## 2019-02-16 NOTE — Addendum Note (Signed)
Addended by: Laddie Aquas A on: 02/16/2019 01:29 PM   Modules accepted: Orders

## 2019-02-18 LAB — URINE CULTURE
MICRO NUMBER:: 497347
SPECIMEN QUALITY:: ADEQUATE

## 2019-02-21 ENCOUNTER — Other Ambulatory Visit: Payer: Self-pay | Admitting: *Deleted

## 2019-03-22 ENCOUNTER — Encounter: Payer: Self-pay | Admitting: Family Medicine

## 2019-03-23 ENCOUNTER — Encounter: Payer: Self-pay | Admitting: Family Medicine

## 2019-03-23 ENCOUNTER — Other Ambulatory Visit: Payer: Self-pay

## 2019-03-23 ENCOUNTER — Ambulatory Visit (INDEPENDENT_AMBULATORY_CARE_PROVIDER_SITE_OTHER): Payer: No Typology Code available for payment source | Admitting: Family Medicine

## 2019-03-23 VITALS — Ht 69.0 in | Wt 253.0 lb

## 2019-03-23 DIAGNOSIS — M25561 Pain in right knee: Secondary | ICD-10-CM | POA: Diagnosis not present

## 2019-03-23 MED ORDER — DICLOFENAC SODIUM 1 % TD GEL: 4.0000 g | Freq: Four times a day (QID) | TRANSDERMAL | 1 refills | Status: DC

## 2019-03-23 NOTE — Progress Notes (Signed)
   Virtual Visit via Video   I connected with patient on 03/23/19 at  9:40 AM EDT by a video enabled telemedicine application and verified that I am speaking with the correct person using two identifiers.  Location patient: Home Location provider: [facility], Office Persons participating in the virtual visit: Patient, Provider, [place] (Jess B)  I discussed the limitations of evaluation and management by telemedicine and the availability of in person appointments. The patient expressed understanding and agreed to proceed.  Subjective:   HPI:   Knee Pain- R knee.  'I don't trust it'.  It feels 'stiff and squishy'.  Unable to bend 'as much as the other one'.  Theodore Scott reports anterior knee tightness w/ anterior swelling.  No known injury.  First noticed ~1 yr ago but has been worsening.  Stopped NSAIDs 2 months ago in hopes of getting pregnant.  ROS:   See pertinent positives and negatives per HPI.  Patient Active Problem List   Diagnosis Date Noted  . GERD (gastroesophageal reflux disease) 01/31/2018  . Hypersomnia 01/01/2017  . Morbid obesity ([place]) 11/09/2016  . Physical exam 11/09/2016    Social History   Tobacco Use  . Smoking status: Never Smoker  . Smokeless tobacco: Never Used  Substance Use Topics  . Alcohol use: Yes    Current Outpatient Medications:  .  levothyroxine (SYNTHROID) 75 MCG tablet, Take 1 tablet (75 mcg total) by mouth daily., Disp: 90 tablet, Rfl: 1 .  metFORMIN (GLUCOPHAGE-XR) 500 MG 24 hr tablet, TAKE 1 TABLET BY MOUTH DAILY WITH BREAKFAST, Disp: 90 tablet, Rfl: 0  Allergies  Allergen Reactions  . Vicodin [Hydrocodone-Acetaminophen] Other (See Comments)    Theodore Scott does not like how she feels "shaky, panicky" sensation    Objective:   Ht 5\' 9"  (1.753 m)   Wt 253 lb (114.8 kg)   BMI 37.36 kg/m   AAOx3, NAD NCAT, EOMI No obvious CN deficits Coloring WNL No obvious redness/swelling of R knee Theodore Scott is able to speak clearly, coherently without  shortness of breath or increased work of breathing.  Thought process is linear.  Mood is appropriate.   Assessment and Plan:   R anterior knee pain- new to provider, ongoing for Theodore Scott.  It is to the point that she doesn't 'trust' her knee.  Feels the anterior knee is tight and restricted.  Given that Theodore Scott has worked so hard on weight loss and she wants to remain active, will start topical Voltaren Gel and refer to sports med.  Theodore Scott expressed understanding and is in agreement w/ plan.    Annye Asa, MD 03/23/2019

## 2019-03-23 NOTE — Progress Notes (Signed)
I have discussed the procedure for the virtual visit with the patient who has given consent to proceed with assessment and treatment.   Right knee pain x several months, worsening lately. Fells squishy and limited in mobility. Pt has been moving recently.   Davis Gourd, CMA

## 2019-03-28 ENCOUNTER — Encounter: Payer: Self-pay | Admitting: Orthopaedic Surgery

## 2019-03-28 ENCOUNTER — Ambulatory Visit: Payer: No Typology Code available for payment source | Admitting: Orthopaedic Surgery

## 2019-03-28 ENCOUNTER — Ambulatory Visit (INDEPENDENT_AMBULATORY_CARE_PROVIDER_SITE_OTHER): Payer: No Typology Code available for payment source

## 2019-03-28 VITALS — BP 117/78 | HR 67 | Ht 69.0 in | Wt 253.0 lb

## 2019-03-28 DIAGNOSIS — M1711 Unilateral primary osteoarthritis, right knee: Secondary | ICD-10-CM | POA: Diagnosis not present

## 2019-03-28 DIAGNOSIS — M25561 Pain in right knee: Secondary | ICD-10-CM

## 2019-03-28 DIAGNOSIS — G8929 Other chronic pain: Secondary | ICD-10-CM

## 2019-03-28 NOTE — Patient Instructions (Signed)
Journal for Nurse Practitioners, 15(4), 263-267. Retrieved July 04, 2018 from http://clinicalkey.com/nursing">  Knee Exercises Ask your health care provider which exercises are safe for you. Do exercises exactly as told by your health care provider and adjust them as directed. It is normal to feel mild stretching, pulling, tightness, or discomfort as you do these exercises. Stop right away if you feel sudden pain or your pain gets worse. Do not begin these exercises until told by your health care provider. Stretching and range-of-motion exercises These exercises warm up your muscles and joints and improve the movement and flexibility of your knee. These exercises also help to relieve pain and swelling. Knee extension, prone 1. Lie on your abdomen (prone position) on a bed. 2. Place your left / right knee just beyond the edge of the surface so your knee is not on the bed. You can put a towel under your left / right thigh just above your kneecap for comfort. 3. Relax your leg muscles and allow gravity to straighten your knee (extension). You should feel a stretch behind your left / right knee. 4. Hold this position for __________ seconds. 5. Scoot up so your knee is supported between repetitions. Repeat __________ times. Complete this exercise __________ times a day. Knee flexion, active  1. Lie on your back with both legs straight. If this causes back discomfort, bend your left / right knee so your foot is flat on the floor. 2. Slowly slide your left / right heel back toward your buttocks. Stop when you feel a gentle stretch in the front of your knee or thigh (flexion). 3. Hold this position for __________ seconds. 4. Slowly slide your left / right heel back to the starting position. Repeat __________ times. Complete this exercise __________ times a day. Quadriceps stretch, prone  1. Lie on your abdomen on a firm surface, such as a bed or padded floor. 2. Bend your left / right knee and hold  your ankle. If you cannot reach your ankle or pant leg, loop a belt around your foot and grab the belt instead. 3. Gently pull your heel toward your buttocks. Your knee should not slide out to the side. You should feel a stretch in the front of your thigh and knee (quadriceps). 4. Hold this position for __________ seconds. Repeat __________ times. Complete this exercise __________ times a day. Hamstring, supine 1. Lie on your back (supine position). 2. Loop a belt or towel over the ball of your left / right foot. The ball of your foot is on the walking surface, right under your toes. 3. Straighten your left / right knee and slowly pull on the belt to raise your leg until you feel a gentle stretch behind your knee (hamstring). ? Do not let your knee bend while you do this. ? Keep your other leg flat on the floor. 4. Hold this position for __________ seconds. Repeat __________ times. Complete this exercise __________ times a day. Strengthening exercises These exercises build strength and endurance in your knee. Endurance is the ability to use your muscles for a long time, even after they get tired. Quadriceps, isometric This exercise stretches the muscles in front of your thigh (quadriceps) without moving your knee joint (isometric). 1. Lie on your back with your left / right leg extended and your other knee bent. Put a rolled towel or small pillow under your knee if told by your health care provider. 2. Slowly tense the muscles in the front of your left /   right thigh. You should see your kneecap slide up toward your hip or see increased dimpling just above the knee. This motion will push the back of the knee toward the floor. 3. For __________ seconds, hold the muscle as tight as you can without increasing your pain. 4. Relax the muscles slowly and completely. Repeat __________ times. Complete this exercise __________ times a day. Straight leg raises This exercise stretches the muscles in front  of your thigh (quadriceps) and the muscles that move your hips (hip flexors). 1. Lie on your back with your left / right leg extended and your other knee bent. 2. Tense the muscles in the front of your left / right thigh. You should see your kneecap slide up or see increased dimpling just above the knee. Your thigh may even shake a bit. 3. Keep these muscles tight as you raise your leg 4-6 inches (10-15 cm) off the floor. Do not let your knee bend. 4. Hold this position for __________ seconds. 5. Keep these muscles tense as you lower your leg. 6. Relax your muscles slowly and completely after each repetition. Repeat __________ times. Complete this exercise __________ times a day. Hamstring, isometric 1. Lie on your back on a firm surface. 2. Bend your left / right knee about __________ degrees. 3. Dig your left / right heel into the surface as if you are trying to pull it toward your buttocks. Tighten the muscles in the back of your thighs (hamstring) to "dig" as hard as you can without increasing any pain. 4. Hold this position for __________ seconds. 5. Release the tension gradually and allow your muscles to relax completely for __________ seconds after each repetition. Repeat __________ times. Complete this exercise __________ times a day. Hamstring curls If told by your health care provider, do this exercise while wearing ankle weights. Begin with __________ lb weights. Then increase the weight by 1 lb (0.5 kg) increments. Do not wear ankle weights that are more than __________ lb. 1. Lie on your abdomen with your legs straight. 2. Bend your left / right knee as far as you can without feeling pain. Keep your hips flat against the floor. 3. Hold this position for __________ seconds. 4. Slowly lower your leg to the starting position. Repeat __________ times. Complete this exercise __________ times a day. Squats This exercise strengthens the muscles in front of your thigh and knee  (quadriceps). 1. Stand in front of a table, with your feet and knees pointing straight ahead. You may rest your hands on the table for balance but not for support. 2. Slowly bend your knees and lower your hips like you are going to sit in a chair. ? Keep your weight over your heels, not over your toes. ? Keep your lower legs upright so they are parallel with the table legs. ? Do not let your hips go lower than your knees. ? Do not bend lower than told by your health care provider. ? If your knee pain increases, do not bend as low. 3. Hold the squat position for __________ seconds. 4. Slowly push with your legs to return to standing. Do not use your hands to pull yourself to standing. Repeat __________ times. Complete this exercise __________ times a day. Wall slides This exercise strengthens the muscles in front of your thigh and knee (quadriceps). 1. Lean your back against a smooth wall or door, and walk your feet out 18-24 inches (46-61 cm) from it. 2. Place your feet hip-width apart. 3.   Slowly slide down the wall or door until your knees bend __________ degrees. Keep your knees over your heels, not over your toes. Keep your knees in line with your hips. 4. Hold this position for __________ seconds. Repeat __________ times. Complete this exercise __________ times a day. Straight leg raises This exercise strengthens the muscles that rotate the leg at the hip and move it away from your body (hip abductors). 1. Lie on your side with your left / right leg in the top position. Lie so your head, shoulder, knee, and hip line up. You may bend your bottom knee to help you keep your balance. 2. Roll your hips slightly forward so your hips are stacked directly over each other and your left / right knee is facing forward. 3. Leading with your heel, lift your top leg 4-6 inches (10-15 cm). You should feel the muscles in your outer hip lifting. ? Do not let your foot drift forward. ? Do not let your knee  roll toward the ceiling. 4. Hold this position for __________ seconds. 5. Slowly return your leg to the starting position. 6. Let your muscles relax completely after each repetition. Repeat __________ times. Complete this exercise __________ times a day. Straight leg raises This exercise stretches the muscles that move your hips away from the front of the pelvis (hip extensors). 1. Lie on your abdomen on a firm surface. You can put a pillow under your hips if that is more comfortable. 2. Tense the muscles in your buttocks and lift your left / right leg about 4-6 inches (10-15 cm). Keep your knee straight as you lift your leg. 3. Hold this position for __________ seconds. 4. Slowly lower your leg to the starting position. 5. Let your leg relax completely after each repetition. Repeat __________ times. Complete this exercise __________ times a day. This information is not intended to replace advice given to you by your health care provider. Make sure you discuss any questions you have with your health care provider. Document Released: 07/29/2005 Document Revised: 07/05/2018 Document Reviewed: 07/05/2018 Elsevier Patient Education  2020 [employer] Lateral Patellar Compression Syndrome Rehab Ask your health care provider which exercises are safe for you. Do exercises exactly as told by your health care provider and adjust them as directed. It is normal to feel mild stretching, pulling, tightness, or discomfort as you do these exercises. Stop right away if you feel sudden pain or your pain gets worse. Do not begin these exercises until told by your health care provider. Stretching and range-of-motion exercises These exercises warm up your muscles and joints and improve the movement and flexibility of your knee. These exercises also help to relieve pain. Medial patellar mobilization, seated This is an exercise in which you push your kneecap (patella) inward (medialmobilization) while seated. 1.  Sit with your left / right knee out in front of you with knee extended. You can put a rolled towel or small pillow under your knee for support. 2. Place your left / right palm on the outside of your knee. Place your index finger and thumb around your kneecap. Your thumb will be above your kneecap and your index finger will be below it. 3. Use your hand to gently slide your kneecap toward the inside of your knee. Keep your leg relaxed. Use your other hand to keep your knee steady. You should feel a slight stretch on the outside edge of your kneecap. 4. Hold this position for __________ seconds. Hamstring, standing  1.  Stand with your left / right leg fully extended and your heel resting on a chair. 2. Arch your lower back slightly. 3. Lean forward at the waist, leading with your chest, until you feel a gentle stretch in the back of your left / right knee or thigh (hamstring). You should not need to lean far to feel the stretch. 4. Hold this position for __________ seconds. Repeat __________ times. Complete this exercise __________ times a day. Iliotibial band stretch An iliotibial band is a strong band of muscle tissue that runs from the outer side of your hip to the outer side of your thigh and knee. 1. Lie on your side with your left / right leg on top. 2. Bend both of your knees and grab your left / right ankle. Stretch out your bottom arm to help you balance. 3. Slowly bring your top knee back so your thigh is behind your body. 4. Slowly lower your knee toward the floor until you feel a gentle stretch on the outside of your left / right hip and thigh. If you do not feel a stretch and your knee will not fall farther, place the heel of your other foot on top of your outer knee and use it to pull your thigh down toward the floor. 5. Hold this position for __________ seconds. 6. Slowly return to the starting position. Repeat __________ times. Complete this exercise __________ times a day.  Quadriceps stretch, prone  1. Lie on your abdomen (prone position) on a firm surface, such as a bed or padded floor. 2. Bend your left/right knee and reach back to hold your ankle or pant leg. If you cannot reach your ankle or pant leg, loop a belt around your foot and grab the belt instead. 3. Gently pull your heel toward your buttocks. Your knee should not slide out to the side. You should feel a stretch in the front of your thigh and knee (quadriceps). 4. Hold this position for __________ seconds. 5. Repeat ________ times. Complete this exercise _________ times a day. Strengthening exercises These exercises build strength and endurance in your knee. Endurance is the ability to use your muscles for a long time, even after they get tired. Quadriceps, isometric This exercise stretches the muscles in the front of your thigh (quadriceps) without moving your knee joint (isometric). 1. Lie on your back with your left / right leg extended and your other knee bent. You may put a rolled towel or small pillow under your knee. 2. Slowly tense the muscles in the front of your left / right thigh. You should see your kneecap slide up toward your hip or see increased dimpling just above the knee. This motion will push the back of the knee toward the floor. 3. For __________ seconds, hold the muscle as tight as you can without increasing your pain. 4. Relax the muscles slowly and completely after each repetition. Repeat __________ times. Complete this exercise __________ times a day. Straight leg raises, supine This exercise stretches the muscles that pull your leg or knee upward toward your torso (hip flexors). 1. Lie on your back (supine position) with your left / right leg extended and your other knee bent. 2. Tense the muscles in the front of your left / right thigh. You should see your kneecap slide up or see increased dimpling just above the knee. 3. Keep these muscles tight as you raise your leg 4-6  inches (10-15 cm) off the floor. 4. Hold this position for  __________ seconds. 5. Keep these muscles tense as you lower your leg. 6. Relax your muscles slowly and completely after each repetition. Repeat __________ times. Complete this exercise __________ times a day. Wall slides This exercise strengthens the muscles in the front and back of your thigh and the muscles in your shin (hip and knee extensors). 1. Lean your back against a smooth wall or door, and walk your feet out 18-24 inches (46-61 cm) from it. 2. Place your feet hip-width apart. 3. Slowly slide down the wall or door until your knees bend __________ degrees. Keep your knees over your heels, not over your toes. Keep your knees in line with your hips. 4. Hold this position for __________ seconds. 5. Use the muscles in the front of your thigh to push yourself up to the standing position. 6. Rest for __________ seconds after each repetition. Repeat __________ times. Complete this exercise __________ times a day. This information is not intended to replace advice given to you by your health care provider. Make sure you discuss any questions you have with your health care provider. Document Released: 09/14/2005 Document Revised: 01/06/2019 Document Reviewed: 07/26/2018 Elsevier Patient Education  2020 [employer].

## 2019-03-28 NOTE — Progress Notes (Signed)
Office Visit Note   Patient: Theodore Scott           Date of Birth: 12-Jul-1981           MRN: [id number] Visit Date: 03/28/2019              Requested by: Sheliah Hatchabori, Katherine E, MD [address] [place] [id number] PCP: Sheliah Hatchabori, Katherine E, MD   Assessment & Plan: Visit Diagnoses:  1. Chronic pain of right knee   2. Patellofemoral arthritis of right knee   3. Primary osteoarthritis of right knee     Plan:  #1: At this time were not going to consider a particular steroid injection because she is trying to get pregnant.  They are going to try to do that this coming month.  So therefore I am going to refrain from that especially with his systemic course. #2: Exercise program #3: She has a prescription for Voltaren gel that she is going to pick up #4: If she does not have improvement and I would like to consider corticosteroid injection we will be happy to perform this.  We will just need to make sure that she is not going to try to become pregnant at the time.  Follow-Up Instructions: Return if symptoms worsen or fail to improve.   Face-to-face time spent with patient was greater than 30 minutes.  Greater than 50% of the time was spent in counseling and coordination of care.  Orders:  Orders Placed This Encounter  Procedures  . XR KNEE 3 VIEW RIGHT   No orders of the defined types were placed in this encounter.     Procedures: No procedures performed   Clinical Data: No additional findings.   Subjective: Chief Complaint  Patient presents with  . Right Knee - Pain   HPI: Patient presents today for right knee pain. No known injury. Patient states that it has bothered her for a year, but gotten worse recently. She said that it "feels squishy and does not trust it".  She has had a fairly significant weight loss also noted.  She said that it has not given way, but feels like it might. She cannot flex her right knee like the left. She does not take anything for  pain due to trying to get pregnant.    Review of Systems  Constitutional: Negative for fatigue.  HENT: Negative for ear pain.   Eyes: Negative for pain.  Respiratory: Negative for shortness of breath.   Cardiovascular: Positive for leg swelling.  Gastrointestinal: Negative for constipation and diarrhea.  Endocrine: Negative for cold intolerance and heat intolerance.  Genitourinary: Negative for difficulty urinating.  Musculoskeletal: Positive for joint swelling.  Skin: Negative for rash.  Allergic/Immunologic: Negative for food allergies.  Neurological: Positive for weakness.  Hematological: Does not bruise/bleed easily.  Psychiatric/Behavioral: Negative for sleep disturbance.     Objective: Vital Signs: BP 117/78   Pulse 67   Ht 5\' 9"  (1.753 m)   Wt 253 lb (114.8 kg)   LMP 03/03/2019   BMI 37.36 kg/m   Physical Exam Constitutional:      Appearance: She is well-developed.  Eyes:     Pupils: Pupils are equal, round, and reactive to light.  Pulmonary:     Effort: Pulmonary effort is normal.  Skin:    General: Skin is warm and dry.  Neurological:     Mental Status: She is alert and oriented to person, place, and time.  Psychiatric:  Behavior: Behavior normal.     Ortho Exam  Exam today reveals the knee to be without  ecchymosis or erythema.  No particular effusion at this time.  No warmth.  Considerable amount of patellofemoral crepitance with range of motion.  She can get to full extension.  Flexes to about 130 degrees.  Some tenderness over the medial joint line.  Calf is supple nontender.  Neurovascular intact distally.  Good hip motion without referred pain.  Specialty Comments:  No specialty comments available.  Imaging: Xr Knee 3 View Right  Result Date: 03/28/2019 Three-view x-ray of the right knee reveals periarticular spurring more along the lateral joint line.  She does have some of the medial femoral condyle.  Maintaining joint spaces at this time.   Bit of irregularity of the distal femoral condyle.  Mild peaking of the medial intercondylar spine.  Lateral positioning of the patella on AP.  And patellofemoral joint she does have some more degenerative changes.    PMFS History: Current Outpatient Medications  Medication Sig Dispense Refill  . diclofenac sodium (VOLTAREN) 1 % GEL Apply 4 g topically 4 (four) times daily. 150 g 1  . levothyroxine (SYNTHROID) 75 MCG tablet Take 1 tablet (75 mcg total) by mouth daily. 90 tablet 1  . metFORMIN (GLUCOPHAGE-XR) 500 MG 24 hr tablet TAKE 1 TABLET BY MOUTH DAILY WITH BREAKFAST 90 tablet 0   No current facility-administered medications for this visit.      Patient Active Problem List   Diagnosis Date Noted  . Patellofemoral arthritis of right knee 03/28/2019  . Primary osteoarthritis of right knee 03/28/2019  . GERD (gastroesophageal reflux disease) 01/31/2018  . Hypersomnia 01/01/2017  . Morbid obesity ([place]) 11/09/2016  . Physical exam 11/09/2016   Past Medical History:  Diagnosis Date  . Anxiety   . Arthritis   . Depression   . Ectopic heartbeat   . PCOS (polycystic ovarian syndrome)     Family History  Problem Relation Age of Onset  . Hypertension Mother   . Atrial fibrillation Mother   . Depression Mother   . Anxiety disorder Mother   . Thyroid disease Mother   . Sleep apnea Mother   . Hyperlipidemia Father   . Hypertension Father   . Depression Father   . Anxiety disorder Father   . Diabetes Father   . Sleep apnea Father   . Bipolar disorder Sister   . Anxiety disorder Sister   . Thyroid disease Maternal Aunt   . Diabetes Paternal Aunt   . Diabetes Paternal Uncle   . Heart attack Paternal Uncle   . Lupus Maternal Grandmother   . Thyroid disease Maternal Grandmother   . Cancer Paternal Grandmother        breast  . Diabetes Paternal Grandmother   . Diabetes Paternal Grandfather     Past Surgical History:  Procedure Laterality Date  . CHOLECYSTECTOMY    .  MODIFIED BROSTROM PROCEDURE Right 2016   Social History   Occupational History  . Not on file  Tobacco Use  . Smoking status: Never Smoker  . Smokeless tobacco: Never Used  Substance and Sexual Activity  . Alcohol use: Yes  . Drug use: No  . Sexual activity: Yes

## 2019-05-03 ENCOUNTER — Other Ambulatory Visit (HOSPITAL_COMMUNITY): Payer: Self-pay | Admitting: Obstetrics and Gynecology

## 2019-05-03 ENCOUNTER — Other Ambulatory Visit: Payer: Self-pay | Admitting: Obstetrics and Gynecology

## 2019-05-03 DIAGNOSIS — Z3490 Encounter for supervision of normal pregnancy, unspecified, unspecified trimester: Secondary | ICD-10-CM

## 2019-05-04 ENCOUNTER — Ambulatory Visit: Payer: No Typology Code available for payment source | Admitting: Family Medicine

## 2019-05-04 ENCOUNTER — Encounter: Payer: No Typology Code available for payment source | Admitting: Physician Assistant

## 2019-05-17 ENCOUNTER — Ambulatory Visit: Payer: No Typology Code available for payment source

## 2019-05-17 ENCOUNTER — Encounter (HOSPITAL_COMMUNITY): Payer: Self-pay

## 2019-05-17 ENCOUNTER — Ambulatory Visit (HOSPITAL_COMMUNITY): Admission: RE | Admit: 2019-05-17 | Payer: No Typology Code available for payment source | Source: Ambulatory Visit

## 2019-05-17 ENCOUNTER — Ambulatory Visit (HOSPITAL_COMMUNITY): Payer: No Typology Code available for payment source

## 2019-05-26 ENCOUNTER — Inpatient Hospital Stay (HOSPITAL_COMMUNITY)
Admission: AD | Admit: 2019-05-26 | Discharge: 2019-05-26 | Disposition: A | Discharge status: Home / Self Care | Payer: No Typology Code available for payment source | Attending: Obstetrics and Gynecology | Admitting: Obstetrics and Gynecology

## 2019-05-26 ENCOUNTER — Encounter (HOSPITAL_COMMUNITY): Payer: Self-pay | Admitting: *Deleted

## 2019-05-26 ENCOUNTER — Inpatient Hospital Stay (HOSPITAL_COMMUNITY): Payer: No Typology Code available for payment source

## 2019-05-26 ENCOUNTER — Other Ambulatory Visit: Payer: Self-pay

## 2019-05-26 DIAGNOSIS — O26899 Other specified pregnancy related conditions, unspecified trimester: Secondary | ICD-10-CM

## 2019-05-26 DIAGNOSIS — O26891 Other specified pregnancy related conditions, first trimester: Secondary | ICD-10-CM | POA: Diagnosis not present

## 2019-05-26 DIAGNOSIS — Z3A08 8 weeks gestation of pregnancy: Secondary | ICD-10-CM | POA: Insufficient documentation

## 2019-05-26 DIAGNOSIS — R109 Unspecified abdominal pain: Secondary | ICD-10-CM | POA: Insufficient documentation

## 2019-05-26 DIAGNOSIS — O3680X0 Pregnancy with inconclusive fetal viability, not applicable or unspecified: Secondary | ICD-10-CM | POA: Diagnosis not present

## 2019-05-26 HISTORY — DX: Sleep apnea, unspecified: G47.30

## 2019-05-26 HISTORY — DX: Hypothyroidism, unspecified: E03.9

## 2019-05-26 LAB — CBC
HCT: 35.6 % — ABNORMAL LOW (ref 36.0–46.0)
Hemoglobin: 12.1 g/dL (ref 12.0–15.0)
MCH: 29.6 pg (ref 26.0–34.0)
MCHC: 34 g/dL (ref 30.0–36.0)
MCV: 87 fL (ref 80.0–100.0)
Platelets: 198 10*3/uL (ref 150–400)
RBC: 4.09 MIL/uL (ref 3.87–5.11)
RDW: 13.1 % (ref 11.5–15.5)
WBC: 6.8 10*3/uL (ref 4.0–10.5)
nRBC: 0 % (ref 0.0–0.2)

## 2019-05-26 LAB — WET PREP, GENITAL
Clue Cells Wet Prep HPF POC: NONE SEEN
Sperm: NONE SEEN
Trich, Wet Prep: NONE SEEN
Yeast Wet Prep HPF POC: NONE SEEN

## 2019-05-26 LAB — URINALYSIS, ROUTINE W REFLEX MICROSCOPIC
Bilirubin Urine: NEGATIVE
Glucose, UA: NEGATIVE mg/dL
Hgb urine dipstick: NEGATIVE
Ketones, ur: NEGATIVE mg/dL
Nitrite: NEGATIVE
Protein, ur: NEGATIVE mg/dL
Specific Gravity, Urine: 1.011 (ref 1.005–1.030)
pH: 5 (ref 5.0–8.0)

## 2019-05-26 LAB — GC/CHLAMYDIA PROBE AMP (~~LOC~~) NOT AT ARMC
Chlamydia: NEGATIVE
Neisseria Gonorrhea: NEGATIVE

## 2019-05-26 LAB — HCG, QUANTITATIVE, PREGNANCY: hCG, Beta Chain, Quant, S: 24054 m[IU]/mL — ABNORMAL HIGH (ref <5)

## 2019-05-26 LAB — POCT PREGNANCY, URINE: Preg Test, Ur: POSITIVE — AB

## 2019-05-26 NOTE — MAU Note (Signed)
.   Theodore Scott is a 38 y.o. at Unknown here in MAU reporting: lower right abdominal pain. PT had IUI July the 15th, and U/S on July the 19th and saw a gestational sac, states the pain started 4 days of IUI.  LMP: 03/30/19 Onset of complaint: after IUI Pain score: 7 Vitals:   05/26/19 1300  BP: (!) 147/83  Pulse: 65  Resp: 16  Temp: 98.3 F (36.8 C)  SpO2: 100%      Lab orders placed from triage: UA/UPT

## 2019-05-26 NOTE — MAU Provider Note (Signed)
History     CSN: [id number]  Arrival date and time: 05/26/19 1227   First Provider Initiated Contact with Patient 05/26/19 1336      Chief Complaint  Patient presents with  . Abdominal Pain   38 y.o. G1 @[redacted]w[redacted]d  by sure LMP presenting with pelvic pain. Reports pain started after IUI on 04/12/19. Pain worsened about 2 days ago. Rates 6/10. Describes as sharp, worse on the right, radiates to back. She had US in office ([facility]) on 05/17/19 that showed IUGS and YS but no FP. She denies VB. Reports a creamy vaginal discharge that is brown at times. No itching or odor. Denies urinary sx. No fevers.   OB History    Gravida  1   Para      Term      Preterm      AB      Living        SAB      TAB      Ectopic      Multiple      Live Births              Past Medical History:  Diagnosis Date  . Anxiety   . Arthritis   . Depression   . Ectopic heartbeat   . Hypothyroidism   . PCOS (polycystic ovarian syndrome)   . Sleep apnea     Past Surgical History:  Procedure Laterality Date  . CHOLECYSTECTOMY    . MODIFIED BROSTROM PROCEDURE Right 2016    Family History  Problem Relation Age of Onset  . Hypertension Mother   . Atrial fibrillation Mother   . Depression Mother   . Anxiety disorder Mother   . Thyroid disease Mother   . Sleep apnea Mother   . Hyperlipidemia Father   . Hypertension Father   . Depression Father   . Anxiety disorder Father   . Diabetes Father   . Sleep apnea Father   . Bipolar disorder Sister   . Anxiety disorder Sister   . Thyroid disease Maternal Aunt   . Diabetes Paternal Aunt   . Diabetes Paternal Uncle   . Heart attack Paternal Uncle   . Lupus Maternal Grandmother   . Thyroid disease Maternal Grandmother   . Cancer Paternal Grandmother        breast  . Diabetes Paternal Grandmother   . Diabetes Paternal Grandfather     Social History   Tobacco Use  . Smoking status: Never Smoker  . Smokeless tobacco: Never  Used  Substance Use Topics  . Alcohol use: Not Currently  . Drug use: No    Allergies:  Allergies  Allergen Reactions  . Vicodin [Hydrocodone-Acetaminophen] Other (See Comments)    Pt does not like how she feels "shaky, panicky" sensation    No medications prior to admission.    Review of Systems  Constitutional: Negative for fever.  Gastrointestinal: Negative for abdominal pain.  Genitourinary: Positive for pelvic pain and vaginal discharge. Negative for dysuria, frequency, hematuria, urgency and vaginal bleeding.  Musculoskeletal: Positive for back pain.   Physical Exam   Blood pressure 120/60, pulse 61, temperature 98.3 F (36.8 C), resp. rate 16, last menstrual period 03/30/2019, SpO2 100 %.  Physical Exam  Nursing note and vitals reviewed. Constitutional: She is oriented to person, place, and time. She appears well-developed and well-nourished. No distress.  HENT:  Head: Normocephalic and atraumatic.  Neck: Normal range of motion.  Cardiovascular: Normal rate.  Respiratory: Effort  normal. No respiratory distress.  GI: Soft. She exhibits no distension and no mass. There is no abdominal tenderness. There is no rebound and no guarding.  Genitourinary:    Genitourinary Comments: External: no lesions or erythema Vagina: rugated, pink, moist, thin yellow/green mucus discharge Uterus: non enlarged, anteverted, non tender, no CMT Adnexae: no masses, no tenderness left, no tenderness right Cervix closed/long    Musculoskeletal: Normal range of motion.  Neurological: She is alert and oriented to person, place, and time.  Skin: Skin is warm and dry.  Psychiatric: She has a normal mood and affect.   Results for orders placed or performed during the hospital encounter of 05/26/19 (from the past 24 hour(s))  Pregnancy, urine POC     Status: Abnormal   Collection Time: 05/26/19 12:36 PM  Result Value Ref Range   Preg Test, Ur POSITIVE (A) NEGATIVE  Urinalysis, Routine w  reflex microscopic     Status: Abnormal   Collection Time: 05/26/19 12:38 PM  Result Value Ref Range   Color, Urine YELLOW YELLOW   APPearance CLEAR CLEAR   Specific Gravity, Urine 1.011 1.005 - 1.030   pH 5.0 5.0 - 8.0   Glucose, UA NEGATIVE NEGATIVE mg/dL   Hgb urine dipstick NEGATIVE NEGATIVE   Bilirubin Urine NEGATIVE NEGATIVE   Ketones, ur NEGATIVE NEGATIVE mg/dL   Protein, ur NEGATIVE NEGATIVE mg/dL   Nitrite NEGATIVE NEGATIVE   Leukocytes,Ua SMALL (A) NEGATIVE   RBC / HPF 0-5 0 - 5 RBC/hpf   WBC, UA 0-5 0 - 5 WBC/hpf   Bacteria, UA RARE (A) NONE SEEN   Squamous Epithelial / LPF 0-5 0 - 5   Mucus PRESENT   CBC     Status: Abnormal   Collection Time: 05/26/19  1:37 PM  Result Value Ref Range   WBC 6.8 4.0 - 10.5 K/uL   RBC 4.09 3.87 - 5.11 MIL/uL   Hemoglobin 12.1 12.0 - 15.0 g/dL   HCT 35.6 (L) 36.0 - 46.0 %   MCV 87.0 80.0 - 100.0 fL   MCH 29.6 26.0 - 34.0 pg   MCHC 34.0 30.0 - 36.0 g/dL   RDW 13.1 11.5 - 15.5 %   Platelets 198 150 - 400 K/uL   nRBC 0.0 0.0 - 0.2 %  Wet prep, genital     Status: Abnormal   Collection Time: 05/26/19  1:37 PM   Specimen: Cervix  Result Value Ref Range   Yeast Wet Prep HPF POC NONE SEEN NONE SEEN   Trich, Wet Prep NONE SEEN NONE SEEN   Clue Cells Wet Prep HPF POC NONE SEEN NONE SEEN   WBC, Wet Prep HPF POC MANY (A) NONE SEEN   Sperm NONE SEEN   hCG, quantitative, pregnancy     Status: Abnormal   Collection Time: 05/26/19  1:59 PM  Result Value Ref Range   hCG, Beta Chain, Quant, S 24,054 (H) <5 mIU/mL   US Ob Less Than 14 Weeks With Ob Transvaginal  Result Date: 05/26/2019 CLINICAL DATA:  Pelvic pain EXAM: OBSTETRIC <14 WK [place] AND TRANSVAGINAL OB US TECHNIQUE: Both transabdominal and transvaginal ultrasound examinations were performed for complete evaluation of the gestation as well as the maternal uterus, adnexal regions, and pelvic cul-de-sac. Transvaginal technique was performed to assess early pregnancy. COMPARISON:  None.  FINDINGS: Intrauterine gestational sac: Visualized Yolk sac:  Visualized Embryo:  Visualized Cardiac Activity: Not visualized CRL:  3 mm   5 w   5 d Subchorionic hemorrhage: There is  a subchorionic hemorrhage measuring 1.6 x 0.4 cm. Maternal uterus/adnexae: There is a leiomyoma within the uterus measuring 2.0 x 2.0 x 1.6 cm. Right ovary measures 4.0 x 2.6 x 2.7 cm. Left ovary measures 3.3 x 2.0 x 2.3 cm. No extrauterine pelvic or adnexal mass. No free pelvic fluid. IMPRESSION: 1. There is an apparent fetal pole measuring 3 mm in length which would correspond to a 6-weeks gestation. No fetal cardiac activity currently. Note that the gestational sac is rather prominent for a fetus of this gestational age. Findings are suspicious but not yet definitive for failed pregnancy. Recommend follow-up US in 10-14 days for definitive diagnosis. This recommendation follows SRU consensus guidelines: Diagnostic Criteria for Nonviable Pregnancy Early in the First Trimester. Malva Limes Med 2013; 295:2841-32. 2.  1.6 x 0.4 cm subchorionic hemorrhage. 3.  2.0 x 2.0 x 1.6 cm intrauterine leiomyoma. 4.  No extrauterine pelvic mass.  No free pelvic fluid. Electronically Signed   By: Bretta Bang III M.D.   On: 05/26/2019 14:53   MAU Course  Procedures  MDM Labs and [place] ordered and reviewed. FP measures [redacted]w[redacted]d which does not correlate with LMP/IUI and no cardiac activity, suspect failed pregnancy. Small fibroid noted, may be causing pain. Discussed findings with pt and spouse. Recommend f/u at [facility] office as scheduled next week.  Assessment and Plan   1. Abdominal pain in pregnancy   2. Pregnancy with inconclusive fetal viability, single or unspecified fetus    Discharge home Follow up at [facility] next week SAB precautions Tylenol prn Heat prn  Allergies as of 05/26/2019      Reactions   Vicodin [hydrocodone-acetaminophen] Other (See Comments)   Pt does not like how she feels "shaky, panicky" sensation       Medication List    TAKE these medications   acetaminophen 500 MG tablet Commonly known as: TYLENOL Take 1,000 mg by mouth every 6 (six) hours as needed.   cholecalciferol 25 MCG (1000 UT) tablet Commonly known as: VITAMIN D3 Take 1,000 Units by mouth daily.   diclofenac sodium 1 % Gel Commonly known as: VOLTAREN Apply 4 g topically 4 (four) times daily.   levothyroxine 75 MCG tablet Commonly known as: SYNTHROID Take 1 tablet (75 mcg total) by mouth daily.   metFORMIN 500 MG 24 hr tablet Commonly known as: GLUCOPHAGE-XR TAKE 1 TABLET BY MOUTH DAILY WITH BREAKFAST   multivitamin-prenatal 27-0.8 MG Tabs tablet Take 1 tablet by mouth daily at 12 noon.      Donette Larry, CNM 05/26/2019, 3:51 PM

## 2019-05-26 NOTE — Discharge Instructions (Signed)
Abdominal Pain During Pregnancy ° °Abdominal pain is common during pregnancy, and has many possible causes. Some causes are more serious than others, and sometimes the cause is not known. Abdominal pain can be a sign that labor is starting. It can also be caused by normal growth and stretching of muscles and ligaments during pregnancy. Always tell your health care provider if you have any abdominal pain. °Follow these instructions at home: °· Do not have sex or put anything in your vagina until your pain goes away completely. °· Get plenty of rest until your pain improves. °· Drink enough fluid to keep your urine pale yellow. °· Take over-the-counter and prescription medicines only as told by your health care provider. °· Keep all follow-up visits as told by your health care provider. This is important. °Contact a health care provider if: °· Your pain continues or gets worse after resting. °· You have lower abdominal pain that: °? Comes and goes at regular intervals. °? Spreads to your back. °? Is similar to menstrual cramps. °· You have pain or burning when you urinate. °Get help right away if: °· You have a fever or chills. °· You have vaginal bleeding. °· You are leaking fluid from your vagina. °· You are passing tissue from your vagina. °· You have vomiting or diarrhea that lasts for more than 24 hours. °· Your baby is moving less than usual. °· You feel very weak or faint. °· You have shortness of breath. °· You develop severe pain in your upper abdomen. °Summary °· Abdominal pain is common during pregnancy, and has many possible causes. °· If you experience abdominal pain during pregnancy, tell your health care provider right away. °· Follow your health care provider's home care instructions and keep all follow-up visits as directed. °This information is not intended to replace advice given to you by your health care provider. Make sure you discuss any questions you have with your health care  provider. °Document Released: 09/14/2005 Document Revised: 01/02/2019 Document Reviewed: 12/17/2016 °Elsevier Patient Education © 2020 [employer] ° °

## 2019-05-31 ENCOUNTER — Other Ambulatory Visit: Payer: Self-pay | Admitting: Family Medicine

## 2019-06-16 ENCOUNTER — Encounter: Payer: No Typology Code available for payment source | Admitting: Family Medicine

## 2019-08-18 ENCOUNTER — Encounter: Payer: Self-pay | Admitting: Family Medicine

## 2019-08-18 ENCOUNTER — Other Ambulatory Visit: Payer: Self-pay

## 2019-08-18 ENCOUNTER — Ambulatory Visit (INDEPENDENT_AMBULATORY_CARE_PROVIDER_SITE_OTHER): Payer: No Typology Code available for payment source | Admitting: Family Medicine

## 2019-08-18 VITALS — BP 119/80 | HR 76 | Temp 97.8°F | Resp 16 | Ht 69.0 in | Wt 263.1 lb

## 2019-08-18 DIAGNOSIS — Z0000 Encounter for general adult medical examination without abnormal findings: Secondary | ICD-10-CM

## 2019-08-18 DIAGNOSIS — E669 Obesity, unspecified: Secondary | ICD-10-CM

## 2019-08-18 LAB — CBC WITH DIFFERENTIAL/PLATELET
Basophils Absolute: 0 10*3/uL (ref 0.0–0.1)
Basophils Relative: 0.5 % (ref 0.0–3.0)
Eosinophils Absolute: 0.1 10*3/uL (ref 0.0–0.7)
Eosinophils Relative: 0.7 % (ref 0.0–5.0)
HCT: 39.1 % (ref 36.0–46.0)
Hemoglobin: 13.1 g/dL (ref 12.0–15.0)
Lymphocytes Relative: 22.1 % (ref 12.0–46.0)
Lymphs Abs: 1.8 10*3/uL (ref 0.7–4.0)
MCHC: 33.6 g/dL (ref 30.0–36.0)
MCV: 89.2 fl (ref 78.0–100.0)
Monocytes Absolute: 0.5 10*3/uL (ref 0.1–1.0)
Monocytes Relative: 6.2 % (ref 3.0–12.0)
Neutro Abs: 5.6 10*3/uL (ref 1.4–7.7)
Neutrophils Relative %: 70.5 % (ref 43.0–77.0)
Platelets: 214 10*3/uL (ref 150.0–400.0)
RBC: 4.38 Mil/uL (ref 3.87–5.11)
RDW: 12.7 % (ref 11.5–15.5)
WBC: 8 10*3/uL (ref 4.0–10.5)

## 2019-08-18 LAB — HEPATIC FUNCTION PANEL
ALT: 16 U/L (ref 0–35)
AST: 15 U/L (ref 0–37)
Albumin: 4.4 g/dL (ref 3.5–5.2)
Alkaline Phosphatase: 40 U/L (ref 39–117)
Bilirubin, Direct: 0.1 mg/dL (ref 0.0–0.3)
Total Bilirubin: 0.4 mg/dL (ref 0.2–1.2)
Total Protein: 7.1 g/dL (ref 6.0–8.3)

## 2019-08-18 LAB — TSH: TSH: 1.97 u[IU]/mL (ref 0.35–4.50)

## 2019-08-18 LAB — BASIC METABOLIC PANEL
BUN: 18 mg/dL (ref 6–23)
CO2: 29 mEq/L (ref 19–32)
Calcium: 9.5 mg/dL (ref 8.4–10.5)
Chloride: 103 mEq/L (ref 96–112)
Creatinine, Ser: 0.79 mg/dL (ref 0.40–1.20)
GFR: 81.79 mL/min (ref >60.00)
Glucose, Bld: 103 mg/dL — ABNORMAL HIGH (ref 70–99)
Potassium: 4.2 mEq/L (ref 3.5–5.1)
Sodium: 140 mEq/L (ref 135–145)

## 2019-08-18 LAB — LIPID PANEL
Cholesterol: 133 mg/dL (ref 0–200)
HDL: 51.2 mg/dL (ref >39.00)
LDL Cholesterol: 70 mg/dL (ref 0–99)
NonHDL: 81.81
Total CHOL/HDL Ratio: 3
Triglycerides: 58 mg/dL (ref 0.0–149.0)
VLDL: 11.6 mg/dL (ref 0.0–40.0)

## 2019-08-18 LAB — VITAMIN D 25 HYDROXY (VIT D DEFICIENCY, FRACTURES): VITD: 33.67 ng/mL (ref 30.00–100.00)

## 2019-08-18 NOTE — Assessment & Plan Note (Signed)
Deteriorated.  Pt has gained 10 lbs since last visit.  Stressed need for healthy diet and regular exercise.  Check labs to risk stratify.  Will follow. 

## 2019-08-18 NOTE — Progress Notes (Addendum)
   Subjective:    Patient ID: Theodore Scott, male    DOB: 1981/07/03, 38 y.o.   MRN: [id number]  HPI CPE- UTD on pap, immunizations.  Pt has gained 10 lbs since last visit.   Review of Systems Patient reports no vision/ hearing changes, adenopathy,fever, persistant/recurrent hoarseness , swallowing issues, chest pain, palpitations, edema, persistant/recurrent cough, hemoptysis, dyspnea (rest/exertional/paroxysmal nocturnal), gastrointestinal bleeding (melena, rectal bleeding), abdominal pain, significant heartburn, bowel changes, GU symptoms (dysuria, hematuria, incontinence), Gyn symptoms (abnormal  bleeding, pain),  syncope, focal weakness, memory loss, numbness & tingling, skin/hair/nail changes, abnormal bruising or bleeding, anxiety, or depression.   This visit occurred during the SARS-CoV-2 public health emergency.  Safety protocols were in place, including screening questions prior to the visit, additional usage of staff PPE, and extensive cleaning of exam room while observing appropriate contact time as indicated for disinfecting solutions.      Objective:   Physical Exam General Appearance:    Alert, cooperative, no distress, appears stated age, obese  Head:    Normocephalic, without obvious abnormality, atraumatic  Eyes:    PERRL, conjunctiva/corneas clear, EOM's intact, fundi    benign, both eyes  Ears:    Normal TM's and external ear canals, both ears  Nose:   Deferred due to COVID  Throat:   Neck:   Supple, symmetrical, trachea midline, no adenopathy;    Thyroid: no enlargement/tenderness/nodules  Back:     Symmetric, no curvature, ROM normal, no CVA tenderness  Lungs:     Clear to auscultation bilaterally, respirations unlabored  Chest Wall:    No tenderness or deformity   Heart:    Regular rate and rhythm, S1 and S2 normal, no murmur, rub   or gallop  Breast Exam:    Deferred to GYN  Abdomen:     Soft, non-tender, bowel sounds active all four quadrants,    no  masses, no organomegaly  Genitalia:    Deferred to GYN  Rectal:    Extremities:   Extremities normal, atraumatic, no cyanosis or edema  Pulses:   2+ and symmetric all extremities  Skin:   Skin color, texture, turgor normal, no rashes or lesions  Lymph nodes:   Cervical, supraclavicular, and axillary nodes normal  Neurologic:   CNII-XII intact, normal strength, sensation and reflexes    throughout          Assessment & Plan:

## 2019-08-18 NOTE — Patient Instructions (Signed)
Follow up in 1 year or as needed We'll notify you of your lab results and make any changes if needed Continue to work on healthy diet and regular exercise- you're doing great!!! Call with any questions or concerns Stay Safe!  Stay Healthy!! Happy Holidays!!! 

## 2019-08-18 NOTE — Assessment & Plan Note (Signed)
Pt's PE WNL w/ exception of obesity.  UTD on pap, immunizations.  Check labs.  Anticipatory guidance provided.  

## 2019-09-01 ENCOUNTER — Other Ambulatory Visit: Payer: Self-pay | Admitting: Family Medicine

## 2019-09-29 NOTE — L&D Delivery Note (Signed)
Patient complete and pushing. SVD of viable male infant over intact perineum. Infant delivered to patient's abdomen. Delayed cord clamping x 11 minutes. Cord clamped x 2, cut. Spontaneous cry heard. Weight and Apgars pending. Cord blood obtained. Placenta delivered spontaneously and intact. Vagina inspected.large 2nd degree with intact sphincter, down to rectum. lacerations noted. Repaired with Vicryl Rapide EBL: 300cc Anesthesia: local

## 2019-10-24 ENCOUNTER — Ambulatory Visit: Payer: No Typology Code available for payment source | Admitting: Family Medicine

## 2019-10-25 ENCOUNTER — Encounter: Payer: Self-pay | Admitting: Family Medicine

## 2019-10-25 ENCOUNTER — Telehealth: Payer: Self-pay

## 2019-10-25 NOTE — Telephone Encounter (Signed)
Patient has an appt scheduled Friday concerning gender identity and ongoing bowel issues. In office or  VV?

## 2019-10-25 NOTE — Telephone Encounter (Signed)
LMOVM informing patient that appt is virtual

## 2019-10-25 NOTE — Telephone Encounter (Signed)
Virtual visit please. 

## 2019-10-27 ENCOUNTER — Other Ambulatory Visit: Payer: Self-pay

## 2019-10-27 ENCOUNTER — Ambulatory Visit (INDEPENDENT_AMBULATORY_CARE_PROVIDER_SITE_OTHER): Payer: No Typology Code available for payment source | Admitting: Family Medicine

## 2019-10-27 ENCOUNTER — Encounter: Payer: Self-pay | Admitting: Family Medicine

## 2019-10-27 VITALS — Ht 69.0 in | Wt 255.0 lb

## 2019-10-27 DIAGNOSIS — K5909 Other constipation: Secondary | ICD-10-CM | POA: Diagnosis not present

## 2019-10-27 DIAGNOSIS — F640 Transsexualism: Secondary | ICD-10-CM

## 2019-10-27 DIAGNOSIS — Z789 Other specified health status: Secondary | ICD-10-CM

## 2019-10-27 DIAGNOSIS — Z3490 Encounter for supervision of normal pregnancy, unspecified, unspecified trimester: Secondary | ICD-10-CM | POA: Diagnosis not present

## 2019-10-27 NOTE — Progress Notes (Signed)
I have discussed the procedure for the virtual visit with the patient who has given consent to proceed with assessment and treatment.   Murphy Bundick L Samaya Boardley, CMA     

## 2019-10-27 NOTE — Progress Notes (Signed)
Virtual Visit via Video   I connected with patient on 10/27/19 at  1:00 PM EST by a video enabled telemedicine application and verified that I am speaking with the correct person using two identifiers.  Location patient: Home Location provider: Acupuncturist, Office Persons participating in the virtual visit: Patient, Provider, [place] (Jess B)  I discussed the limitations of evaluation and management by telemedicine and the availability of in person appointments. The patient expressed understanding and agreed to proceed.  Subjective:   HPI:   Gender identity- late November she told her partner that she was tired of 'not being who I am'.  Partner is wholly supportive.    Pregnancy- seeing Dr Royston Sinner.  Currently 8 weeks.  Has had confirmation [place] w/ strong heartbeat.  Chronic constipation- pt reports this is ongoing issue but hasn't given it much thought until recently when she was trying to use the restroom and work assumed she had disappeared or taken an unsanctioned break.  Asking for documentation to allow for possibly longer restroom breaks.  ROS:   See pertinent positives and negatives per HPI.  Patient Active Problem List   Diagnosis Date Noted  . Obesity (BMI 30-39.9) 08/18/2019  . Patellofemoral arthritis of right knee 03/28/2019  . Primary osteoarthritis of right knee 03/28/2019  . GERD (gastroesophageal reflux disease) 01/31/2018  . Hypersomnia 01/01/2017  . Physical exam 11/09/2016    Social History   Tobacco Use  . Smoking status: Never Smoker  . Smokeless tobacco: Never Used  Substance Use Topics  . Alcohol use: Not Currently    Current Outpatient Medications:  .  acetaminophen (TYLENOL) 500 MG tablet, Take 1,000 mg by mouth every 6 (six) hours as needed., Disp: , Rfl:  .  cholecalciferol (VITAMIN D3) 25 MCG (1000 UT) tablet, Take 1,000 Units by mouth daily., Disp: , Rfl:  .  diclofenac sodium (VOLTAREN) 1 % GEL, Apply 4 g topically 4 (four) times daily.,  Disp: 150 g, Rfl: 1 .  levothyroxine (SYNTHROID) 75 MCG tablet, Take 1 tablet (75 mcg total) by mouth daily., Disp: 90 tablet, Rfl: 1 .  metFORMIN (GLUCOPHAGE-XR) 500 MG 24 hr tablet, TAKE 1 TABLET BY MOUTH DAILY WITH BREAKFAST, Disp: 90 tablet, Rfl: 0 .  Prenatal Vit-Fe Fumarate-FA (MULTIVITAMIN-PRENATAL) 27-0.8 MG TABS tablet, Take 1 tablet by mouth daily at 12 noon., Disp: , Rfl:  .  OVIDREL 250 MCG/0.5ML injection, , Disp: , Rfl:   Allergies  Allergen Reactions  . Vicodin [Hydrocodone-Acetaminophen] Other (See Comments)    Pt does not like how she feels "shaky, panicky" sensation    Objective:   Ht 5\' 9"  (1.753 m)   Wt 255 lb (115.7 kg)   LMP 08/31/2019 (Exact Date)   BMI 37.66 kg/m  AAOx3, NAD NCAT, EOMI No obvious CN deficits Coloring WNL Pt is able to speak clearly, coherently without shortness of breath or increased work of breathing.  Thought process is linear.  Mood is appropriate.   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  His spouse is supportive of this decision.  Is aware that he will have to wait until after delivery and/or breast feeding to start hormonal tx.  Plan is eventually to have top surgery along w/ hormonal tx.  Discussed the need for a multidisciplinary team approach to this- including psychologists, endocrinologists, and surgeons.  Information provided for  both [facility] and [employer] transgender teams.  Pt appreciative for info.  Pregnancy- pt is very excited.  Had confirmatory [place] and saw heartbeat.  He is aware that this is interesting timing since he just decided to become male in November and found out he was pregnant in Dec.  Has OB appt pending.  Chronic constipation- ongoing issue.  Needs letter for work indicating that prolonged bathroom breaks may be required.   Neena Rhymes, MD 10/27/2019

## 2019-10-30 ENCOUNTER — Encounter: Payer: Self-pay | Admitting: Family Medicine

## 2019-11-06 ENCOUNTER — Inpatient Hospital Stay (HOSPITAL_COMMUNITY): Payer: No Typology Code available for payment source

## 2019-11-06 ENCOUNTER — Encounter (HOSPITAL_COMMUNITY): Payer: Self-pay | Admitting: Obstetrics and Gynecology

## 2019-11-06 ENCOUNTER — Inpatient Hospital Stay (HOSPITAL_COMMUNITY)
Admission: AD | Admit: 2019-11-06 | Discharge: 2019-11-06 | Disposition: A | Discharge status: Home / Self Care | Payer: No Typology Code available for payment source | Attending: Obstetrics and Gynecology | Admitting: Obstetrics and Gynecology

## 2019-11-06 DIAGNOSIS — O209 Hemorrhage in early pregnancy, unspecified: Secondary | ICD-10-CM | POA: Diagnosis not present

## 2019-11-06 DIAGNOSIS — Z3491 Encounter for supervision of normal pregnancy, unspecified, first trimester: Secondary | ICD-10-CM

## 2019-11-06 DIAGNOSIS — N939 Abnormal uterine and vaginal bleeding, unspecified: Secondary | ICD-10-CM | POA: Diagnosis not present

## 2019-11-06 DIAGNOSIS — Z3A09 9 weeks gestation of pregnancy: Secondary | ICD-10-CM | POA: Diagnosis not present

## 2019-11-06 DIAGNOSIS — O26891 Other specified pregnancy related conditions, first trimester: Secondary | ICD-10-CM

## 2019-11-06 LAB — URINALYSIS, ROUTINE W REFLEX MICROSCOPIC
Bilirubin Urine: NEGATIVE
Glucose, UA: NEGATIVE mg/dL
Ketones, ur: 5 mg/dL — AB
Leukocytes,Ua: NEGATIVE
Nitrite: NEGATIVE
Protein, ur: NEGATIVE mg/dL
Specific Gravity, Urine: 1.015 (ref 1.005–1.030)
pH: 6 (ref 5.0–8.0)

## 2019-11-06 LAB — CBC
HCT: 35.7 % — ABNORMAL LOW (ref 36.0–46.0)
Hemoglobin: 12 g/dL (ref 12.0–15.0)
MCH: 30.4 pg (ref 26.0–34.0)
MCHC: 33.6 g/dL (ref 30.0–36.0)
MCV: 90.4 fL (ref 80.0–100.0)
Platelets: 201 10*3/uL (ref 150–400)
RBC: 3.95 MIL/uL (ref 3.87–5.11)
RDW: 13.3 % (ref 11.5–15.5)
WBC: 10.2 10*3/uL (ref 4.0–10.5)
nRBC: 0 % (ref 0.0–0.2)

## 2019-11-06 LAB — HCG, QUANTITATIVE, PREGNANCY: hCG, Beta Chain, Quant, S: 89879 m[IU]/mL — ABNORMAL HIGH (ref <5)

## 2019-11-06 LAB — WET PREP, GENITAL
Sperm: NONE SEEN
Trich, Wet Prep: NONE SEEN
Yeast Wet Prep HPF POC: NONE SEEN

## 2019-11-06 LAB — ABO/RH: ABO/RH(D): A POS

## 2019-11-06 LAB — POCT PREGNANCY, URINE: Preg Test, Ur: POSITIVE — AB

## 2019-11-06 MED ORDER — ONDANSETRON 4 MG PO TBDP: 8.0000 mg | ORAL_TABLET | Freq: Once | ORAL | Status: DC

## 2019-11-06 NOTE — MAU Note (Signed)
States she wiped earlier after using the restroom around 1630 and had some bleeding with a clot.  Since then just pink discharge.  No other bleeding with the pregnancy.  Denies pain.

## 2019-11-06 NOTE — MAU Provider Note (Addendum)
Patient Theodore Scott is a 39 y.o. G2P0010 at [redacted]w[redacted]d   here for complaints of vaginal bleeding that started today at 4 pm He denies vaginal discharge, dysuria, pain with intercourse. No cramping, no shortness of breath, no fever.  He has had an [place] in the office on [id number] with a heart rate of 143 (at [facility]).   The pregnancy is a result of an IUI insemination.  History     CSN: [id number]  Arrival date and time: 11/06/19 2026   First Provider Initiated Contact with Patient 11/06/19 2136      Chief Complaint  Patient presents with  . Vaginal Bleeding   Vaginal Bleeding This is a new problem. The current episode started today. The problem occurs intermittently. Pertinent negatives include no fatigue, fever, nausea or vomiting.  Bleeding was like the beginning or end of period; small clot noted. Denies abnormal odor. He put a pad on but did not have any bleeding on it.   OB History    Gravida  2   Para      Term      Preterm      AB  1   Living        SAB  1   TAB      Ectopic      Multiple      Live Births              Past Medical History:  Diagnosis Date  . Anxiety   . Arthritis   . Depression   . Ectopic heartbeat   . Hypothyroidism   . PCOS (polycystic ovarian syndrome)   . Sleep apnea     Past Surgical History:  Procedure Laterality Date  . CHOLECYSTECTOMY    . MODIFIED BROSTROM PROCEDURE Right 2016    Family History  Problem Relation Age of Onset  . Hypertension Mother   . Atrial fibrillation Mother   . Depression Mother   . Anxiety disorder Mother   . Thyroid disease Mother   . Sleep apnea Mother   . Hyperlipidemia Father   . Hypertension Father   . Depression Father   . Anxiety disorder Father   . Diabetes Father   . Sleep apnea Father   . Bipolar disorder Sister   . Anxiety disorder Sister   . Thyroid disease Maternal Aunt   . Diabetes Paternal Aunt   . Diabetes Paternal Uncle   . Heart attack Paternal Uncle    . Lupus Maternal Grandmother   . Thyroid disease Maternal Grandmother   . Cancer Paternal Grandmother        breast  . Diabetes Paternal Grandmother   . Diabetes Paternal Grandfather     Social History   Tobacco Use  . Smoking status: Never Smoker  . Smokeless tobacco: Never Used  Substance Use Topics  . Alcohol use: Not Currently  . Drug use: No    Allergies:  Allergies  Allergen Reactions  . Vicodin [Hydrocodone-Acetaminophen] Other (See Comments)    Pt does not like how she feels "shaky, panicky" sensation    Medications Prior to Admission  Medication Sig Dispense Refill Last Dose  . acetaminophen (TYLENOL) 500 MG tablet Take 1,000 mg by mouth every 6 (six) hours as needed.   11/05/2019 at Unknown time  . cholecalciferol (VITAMIN D3) 25 MCG (1000 UT) tablet Take 1,000 Units by mouth daily.   11/06/2019 at Unknown time  . doxylamine, Sleep, (UNISOM) 25 MG tablet Take 25  mg by mouth at bedtime as needed.     Marland Kitchen levothyroxine (SYNTHROID) 75 MCG tablet Take 1 tablet (75 mcg total) by mouth daily. 90 tablet 1 11/06/2019 at Unknown time  . metFORMIN (GLUCOPHAGE-XR) 500 MG 24 hr tablet TAKE 1 TABLET BY MOUTH DAILY WITH BREAKFAST 90 tablet 0 11/06/2019 at Unknown time  . Prenatal Vit-Fe Fumarate-FA (MULTIVITAMIN-PRENATAL) 27-0.8 MG TABS tablet Take 1 tablet by mouth daily at 12 noon.   11/06/2019 at Unknown time  . vitamin B-6 (PYRIDOXINE) 25 MG tablet Take 25 mg by mouth daily.     . diclofenac sodium (VOLTAREN) 1 % GEL Apply 4 g topically 4 (four) times daily. 150 g 1   . OVIDREL 250 MCG/0.5ML injection        Review of Systems  Constitutional: Negative.  Negative for fatigue and fever.  HENT: Negative.   Gastrointestinal: Negative for diarrhea, nausea, rectal pain and vomiting.  Genitourinary: Positive for vaginal bleeding.  Neurological: Negative.   Psychiatric/Behavioral: Negative.    Physical Exam   Blood pressure 117/61, pulse 79, temperature 98.9 F (37.2 C), resp. rate  19, weight 115.5 kg, last menstrual period 08/31/2019.  Physical Exam  Constitutional: He appears well-developed.  HENT:  Head: Normocephalic.  Respiratory: Effort normal.  GI: Soft.  Genitourinary:    Vaginal discharge (slight amount of dark brown blood, no active bleeding, Cervix is FT, non-tender. ) present.     No vaginal tenderness.  No tenderness in the vagina.  Musculoskeletal:        General: Normal range of motion.     Cervical back: Normal range of motion.  Neurological: He is alert.  Skin: Skin is warm and dry.    MAU Course  Procedures  MDM -will send for [place] due to vaginal bleeding.  -wet prep and GC pending.  -CBC normal, Blood Type is A pos 2211: patient complaining of nausea, will order Zofran.   2220: Care assumed by Steward Drone CNM   Charlesetta Garibaldi Kooistra 11/06/2019, 10:19 PM   Labs and [place] reviewed Patient declined medication ordered for nausea - reports crackers help nausea, crackers given to patient after [place] reviewed.   Results for orders placed or performed during the hospital encounter of 11/06/19 (from the past 24 hour(s))  CBC     Status: Abnormal   Collection Time: 11/06/19  8:57 PM  Result Value Ref Range   WBC 10.2 4.0 - 10.5 K/uL   RBC 3.95 3.87 - 5.11 MIL/uL   Hemoglobin 12.0 12.0 - 15.0 g/dL   HCT 60.7 (L) 37.1 - 06.2 %   MCV 90.4 80.0 - 100.0 fL   MCH 30.4 26.0 - 34.0 pg   MCHC 33.6 30.0 - 36.0 g/dL   RDW 69.4 85.4 - 62.7 %   Platelets 201 150 - 400 K/uL   nRBC 0.0 0.0 - 0.2 %  ABO/Rh     Status: None   Collection Time: 11/06/19  8:57 PM  Result Value Ref Range   ABO/RH(D) A POS    No rh immune globuloin      NOT A RH IMMUNE GLOBULIN CANDIDATE, PT RH POSITIVE Performed at [facility], [address], [place], [place] [address]   hCG, quantitative, pregnancy     Status: Abnormal   Collection Time: 11/06/19  8:57 PM  Result Value Ref Range   hCG, Beta Chain, Quant, S 89,879 (H) <5 mIU/mL  Pregnancy, urine POC      Status: Abnormal   Collection  Time: 11/06/19  9:05 PM  Result Value Ref Range   Preg Test, Ur POSITIVE (A) NEGATIVE  Urinalysis, Routine w reflex microscopic     Status: Abnormal   Collection Time: 11/06/19  9:07 PM  Result Value Ref Range   Color, Urine YELLOW YELLOW   APPearance CLEAR CLEAR   Specific Gravity, Urine 1.015 1.005 - 1.030   pH 6.0 5.0 - 8.0   Glucose, UA NEGATIVE NEGATIVE mg/dL   Hgb urine dipstick SMALL (A) NEGATIVE   Bilirubin Urine NEGATIVE NEGATIVE   Ketones, ur 5 (A) NEGATIVE mg/dL   Protein, ur NEGATIVE NEGATIVE mg/dL   Nitrite NEGATIVE NEGATIVE   Leukocytes,Ua NEGATIVE NEGATIVE   RBC / HPF 0-5 0 - 5 RBC/hpf   WBC, UA 0-5 0 - 5 WBC/hpf   Bacteria, UA RARE (A) NONE SEEN   Squamous Epithelial / LPF 0-5 0 - 5  Wet prep, genital     Status: Abnormal   Collection Time: 11/06/19  9:47 PM  Result Value Ref Range   Yeast Wet Prep HPF POC NONE SEEN NONE SEEN   Trich, Wet Prep NONE SEEN NONE SEEN   Clue Cells Wet Prep HPF POC PRESENT (A) NONE SEEN   WBC, Wet Prep HPF POC MANY (A) NONE SEEN   Sperm NONE SEEN    US OB Comp Less 14 Wks  Result Date: 11/06/2019 CLINICAL DATA:  Bleeding EXAM: OBSTETRIC <14 WK ULTRASOUND TECHNIQUE: Transabdominal ultrasound was performed for evaluation of the gestation as well as the maternal uterus and adnexal regions. COMPARISON:  None. FINDINGS: Intrauterine gestational sac: Single Yolk sac:  Visualized Embryo:  Visualized Cardiac Activity: Visualized Heart Rate: 169 bpm MSD:    mm    w     d CRL:   27.1 mm   9 w 4 d                  [place] EDC: 06/06/2020 Subchorionic hemorrhage:  None visualized. Maternal uterus/adnexae: No adnexal mass or free fluid. IMPRESSION: Nine week 4 day intrauterine pregnancy. Fetal heart rate 169 beats per minute. No acute maternal findings. Electronically Signed   By: Charlett Nose M.D.   On: 11/06/2019 22:37   Discussed results of labs and [place] with patient and partner. Reviewed HR of 169 on [place]. Discussed with  patient bleeding precautions and reasons to return to MAU. Encouraged to follow up as scheduled in the office with Dr Elon Spanner. Pt stable at time of discharge.   Assessment and Plan   1. Normal IUP (intrauterine pregnancy) on prenatal ultrasound, first trimester   2. Vaginal bleeding   3. [redacted] weeks gestation of pregnancy    Discharge home Follow up as scheduled in the office for prenatal care Return to MAU as needed for reasons discussed and/or emergencies  Bleeding precautions and pelvic rest   Follow-up Information    [facility] Follow up.   Why: Follow up as scheduled for prenatal appointments  Contact information: [address] [place] [address] [phone]          Allergies as of 11/06/2019      Reactions   Vicodin [hydrocodone-acetaminophen] Other (See Comments)   Pt does not like how she feels "shaky, panicky" sensation      Medication List    TAKE these medications   acetaminophen 500 MG tablet Commonly known as: TYLENOL Take 1,000 mg by mouth every 6 (six) hours as needed.   cholecalciferol 25 MCG (  1000 UNIT) tablet Commonly known as: VITAMIN D3 Take 1,000 Units by mouth daily.   diclofenac sodium 1 % Gel Commonly known as: VOLTAREN Apply 4 g topically 4 (four) times daily.   doxylamine (Sleep) 25 MG tablet Commonly known as: UNISOM Take 25 mg by mouth at bedtime as needed.   levothyroxine 75 MCG tablet Commonly known as: SYNTHROID Take 1 tablet (75 mcg total) by mouth daily.   metFORMIN 500 MG 24 hr tablet Commonly known as: GLUCOPHAGE-XR TAKE 1 TABLET BY MOUTH DAILY WITH BREAKFAST   multivitamin-prenatal 27-0.8 MG Tabs tablet Take 1 tablet by mouth daily at 12 noon.   Ovidrel 250 MCG/0.5ML injection Generic drug: Choriogonadotropin Alfa   vitamin B-6 25 MG tablet Commonly known as: pyridOXINE Take 25 mg by mouth daily.      Lajean Manes, CNM 11/06/19, 11:47 PM

## 2019-11-07 LAB — GC/CHLAMYDIA PROBE AMP (~~LOC~~) NOT AT ARMC
Chlamydia: NEGATIVE
Comment: NEGATIVE
Comment: NORMAL
Neisseria Gonorrhea: NEGATIVE

## 2019-11-17 ENCOUNTER — Telehealth: Payer: Self-pay | Admitting: Family Medicine

## 2019-11-17 NOTE — Telephone Encounter (Signed)
I have placed a work [employer] information [employer] manager. In the bin up front with a charge sheet.

## 2019-11-17 NOTE — Telephone Encounter (Signed)
Paperwork given to PCP for review.  

## 2019-11-21 DIAGNOSIS — Z0279 Encounter for issue of other medical certificate: Secondary | ICD-10-CM

## 2019-11-21 NOTE — Telephone Encounter (Signed)
Picked up from the back and faxed to the # provided on them. Made pt aware and sent to scan...ELEA

## 2019-11-21 NOTE — Telephone Encounter (Signed)
Form completed and placed in basket  

## 2019-11-21 NOTE — Telephone Encounter (Signed)
FYI

## 2019-11-27 ENCOUNTER — Telehealth: Payer: Self-pay | Admitting: Family Medicine

## 2019-11-27 NOTE — Telephone Encounter (Signed)
I have placed a accommodation form from [employer] in the bin up front with a charge sheet.

## 2019-11-28 ENCOUNTER — Encounter: Payer: Self-pay | Admitting: General Practice

## 2019-11-28 NOTE — Telephone Encounter (Signed)
My chart message also sent to pt

## 2019-11-28 NOTE — Telephone Encounter (Signed)
Unfortunately these forms are fairly specific and want to know how many extra bathroom breaks are needed each day and how many minutes per break are anticipated.  Please ask patient to give her best guess

## 2019-11-28 NOTE — Telephone Encounter (Signed)
Paperwork given to PCP.  

## 2019-11-28 NOTE — Telephone Encounter (Signed)
Called and LMOVM for pt to return call in regards to PCP questions.

## 2019-11-29 NOTE — Telephone Encounter (Signed)
Forms picked up faxed and sent to scan

## 2019-11-29 NOTE — Telephone Encounter (Signed)
Pt answered message in mychart.

## 2019-12-13 ENCOUNTER — Encounter: Payer: Self-pay | Admitting: Family Medicine

## 2020-01-05 ENCOUNTER — Encounter: Payer: Self-pay | Admitting: Family Medicine

## 2020-03-11 ENCOUNTER — Encounter: Payer: Self-pay | Admitting: Family Medicine

## 2020-03-15 ENCOUNTER — Other Ambulatory Visit: Payer: Self-pay | Admitting: Family Medicine

## 2020-03-28 ENCOUNTER — Telehealth: Payer: Self-pay | Admitting: Family Medicine

## 2020-03-28 NOTE — Telephone Encounter (Signed)
Pt dropped off a Medical Evaluation form, I have placed it in the bin up front with a charge sheet.  

## 2020-03-28 NOTE — Telephone Encounter (Signed)
Form completed and placed in basket  

## 2020-03-28 NOTE — Telephone Encounter (Signed)
Will review papers and give to PCP for completion

## 2020-05-23 ENCOUNTER — Telehealth (HOSPITAL_COMMUNITY): Payer: Self-pay | Admitting: *Deleted

## 2020-05-23 NOTE — Telephone Encounter (Signed)
Preadmission screen  

## 2020-05-24 ENCOUNTER — Telehealth (HOSPITAL_COMMUNITY): Payer: Self-pay | Admitting: *Deleted

## 2020-05-24 NOTE — Telephone Encounter (Signed)
Preadmission screen  

## 2020-05-25 ENCOUNTER — Other Ambulatory Visit: Payer: Self-pay

## 2020-05-25 ENCOUNTER — Inpatient Hospital Stay (HOSPITAL_COMMUNITY)
Admission: AD | Admit: 2020-05-25 | Discharge: 2020-05-27 | DRG: 807 | Disposition: A | Discharge status: Home / Self Care | Payer: No Typology Code available for payment source | Attending: Family Medicine | Admitting: Family Medicine

## 2020-05-25 ENCOUNTER — Encounter (HOSPITAL_COMMUNITY): Payer: Self-pay

## 2020-05-25 DIAGNOSIS — F649 Gender identity disorder, unspecified: Secondary | ICD-10-CM | POA: Diagnosis present

## 2020-05-25 DIAGNOSIS — Z20822 Contact with and (suspected) exposure to covid-19: Secondary | ICD-10-CM | POA: Diagnosis present

## 2020-05-25 DIAGNOSIS — E039 Hypothyroidism, unspecified: Secondary | ICD-10-CM | POA: Diagnosis present

## 2020-05-25 DIAGNOSIS — Z3A00 Weeks of gestation of pregnancy not specified: Secondary | ICD-10-CM

## 2020-05-25 DIAGNOSIS — Z789 Other specified health status: Secondary | ICD-10-CM | POA: Diagnosis present

## 2020-05-25 DIAGNOSIS — O99284 Endocrine, nutritional and metabolic diseases complicating childbirth: Principal | ICD-10-CM | POA: Diagnosis present

## 2020-05-25 DIAGNOSIS — O99344 Other mental disorders complicating childbirth: Secondary | ICD-10-CM | POA: Diagnosis present

## 2020-05-25 MED ORDER — LACTATED RINGERS IV SOLN: INTRAVENOUS | Status: DC

## 2020-05-25 NOTE — MAU Note (Signed)
Theodore Scott is a 39 y.o. at Unknown here in MAU reporting: contractions Denies LOF +FM Pain score: 10 Vitals:   05/25/20 2319  BP: 136/74  Pulse: 65  Resp: 20  Temp: (!) 97.5 F (36.4 C)  SpO2: 98%

## 2020-05-26 ENCOUNTER — Encounter (HOSPITAL_COMMUNITY): Payer: Self-pay | Admitting: Obstetrics and Gynecology

## 2020-05-26 ENCOUNTER — Encounter (HOSPITAL_COMMUNITY): Payer: Self-pay | Admitting: Anesthesiology

## 2020-05-26 DIAGNOSIS — E039 Hypothyroidism, unspecified: Secondary | ICD-10-CM | POA: Diagnosis present

## 2020-05-26 DIAGNOSIS — O99283 Endocrine, nutritional and metabolic diseases complicating pregnancy, third trimester: Secondary | ICD-10-CM

## 2020-05-26 DIAGNOSIS — Z3A38 38 weeks gestation of pregnancy: Secondary | ICD-10-CM

## 2020-05-26 DIAGNOSIS — Z3A00 Weeks of gestation of pregnancy not specified: Secondary | ICD-10-CM | POA: Diagnosis not present

## 2020-05-26 DIAGNOSIS — O99344 Other mental disorders complicating childbirth: Secondary | ICD-10-CM | POA: Diagnosis present

## 2020-05-26 DIAGNOSIS — O99284 Endocrine, nutritional and metabolic diseases complicating childbirth: Secondary | ICD-10-CM | POA: Diagnosis present

## 2020-05-26 DIAGNOSIS — F649 Gender identity disorder, unspecified: Secondary | ICD-10-CM | POA: Diagnosis present

## 2020-05-26 DIAGNOSIS — Z20822 Contact with and (suspected) exposure to covid-19: Secondary | ICD-10-CM | POA: Diagnosis present

## 2020-05-26 DIAGNOSIS — O26893 Other specified pregnancy related conditions, third trimester: Secondary | ICD-10-CM | POA: Diagnosis present

## 2020-05-26 LAB — SARS CORONAVIRUS 2 BY RT PCR (HOSPITAL ORDER, PERFORMED IN ~~LOC~~ HOSPITAL LAB): SARS Coronavirus 2: NEGATIVE

## 2020-05-26 LAB — CBC
HCT: 37.2 % (ref 36.0–46.0)
Hemoglobin: 12.6 g/dL (ref 12.0–15.0)
MCH: 32.1 pg (ref 26.0–34.0)
MCHC: 33.9 g/dL (ref 30.0–36.0)
MCV: 94.9 fL (ref 80.0–100.0)
Platelets: 184 10*3/uL (ref 150–400)
RBC: 3.92 MIL/uL (ref 3.87–5.11)
RDW: 13.7 % (ref 11.5–15.5)
WBC: 15.5 10*3/uL — ABNORMAL HIGH (ref 4.0–10.5)
nRBC: 0 % (ref 0.0–0.2)

## 2020-05-26 LAB — RPR: RPR Ser Ql: NONREACTIVE

## 2020-05-26 LAB — TYPE AND SCREEN
ABO/RH(D): A POS
Antibody Screen: NEGATIVE

## 2020-05-26 MED ORDER — SIMETHICONE 80 MG PO CHEW: 80.0000 mg | CHEWABLE_TABLET | ORAL | Status: DC | PRN

## 2020-05-26 MED ORDER — BENZOCAINE-MENTHOL 20-0.5 % EX AERO
1.0000 "application " | INHALATION_SPRAY | CUTANEOUS | Status: DC | PRN
  Administered 2020-05-26 – 2020-05-27 (×2): 1 via TOPICAL
  Filled 2020-05-26 (×2): qty 56

## 2020-05-26 MED ORDER — FENTANYL-BUPIVACAINE-NACL 0.5-0.125-0.9 MG/250ML-% EP SOLN
EPIDURAL | Status: AC
  Filled 2020-05-26: qty 250

## 2020-05-26 MED ORDER — ONDANSETRON HCL 4 MG PO TABS: 4.0000 mg | ORAL_TABLET | ORAL | Status: DC | PRN

## 2020-05-26 MED ORDER — TETANUS-DIPHTH-ACELL PERTUSSIS 5-2.5-18.5 LF-MCG/0.5 IM SUSP: 0.5000 mL | Freq: Once | INTRAMUSCULAR | Status: DC

## 2020-05-26 MED ORDER — ACETAMINOPHEN 325 MG PO TABS: 650.0000 mg | ORAL_TABLET | ORAL | Status: DC | PRN

## 2020-05-26 MED ORDER — DIPHENHYDRAMINE HCL 25 MG PO CAPS: 25.0000 mg | ORAL_CAPSULE | Freq: Four times a day (QID) | ORAL | Status: DC | PRN

## 2020-05-26 MED ORDER — DIBUCAINE (PERIANAL) 1 % EX OINT
1.0000 "application " | TOPICAL_OINTMENT | CUTANEOUS | Status: DC | PRN
  Administered 2020-05-26: 1 via RECTAL
  Filled 2020-05-26: qty 28

## 2020-05-26 MED ORDER — ONDANSETRON HCL 4 MG/2ML IJ SOLN: 4.0000 mg | Freq: Four times a day (QID) | INTRAMUSCULAR | Status: DC | PRN

## 2020-05-26 MED ORDER — PRENATAL MULTIVITAMIN CH
1.0000 | ORAL_TABLET | Freq: Every day | ORAL | Status: DC
  Administered 2020-05-26 – 2020-05-27 (×2): 1 via ORAL
  Filled 2020-05-26 (×2): qty 1

## 2020-05-26 MED ORDER — PHENYLEPHRINE 40 MCG/ML (10ML) SYRINGE FOR IV PUSH (FOR BLOOD PRESSURE SUPPORT): 80.0000 ug | PREFILLED_SYRINGE | INTRAVENOUS | Status: DC | PRN

## 2020-05-26 MED ORDER — EPHEDRINE 5 MG/ML INJ: 10.0000 mg | INTRAVENOUS | Status: DC | PRN

## 2020-05-26 MED ORDER — IBUPROFEN 600 MG PO TABS
600.0000 mg | ORAL_TABLET | Freq: Four times a day (QID) | ORAL | Status: DC
  Administered 2020-05-26 – 2020-05-27 (×6): 600 mg via ORAL
  Filled 2020-05-26 (×7): qty 1

## 2020-05-26 MED ORDER — FLEET ENEMA 7-19 GM/118ML RE ENEM: 1.0000 | ENEMA | RECTAL | Status: DC | PRN

## 2020-05-26 MED ORDER — COCONUT OIL OIL
1.0000 "application " | TOPICAL_OIL | Status: DC | PRN
  Administered 2020-05-26: 1 via TOPICAL

## 2020-05-26 MED ORDER — SENNOSIDES-DOCUSATE SODIUM 8.6-50 MG PO TABS
2.0000 | ORAL_TABLET | ORAL | Status: DC
  Administered 2020-05-26: 2 via ORAL
  Filled 2020-05-26: qty 2

## 2020-05-26 MED ORDER — LIDOCAINE HCL (PF) 1 % IJ SOLN
INTRAMUSCULAR | Status: AC
  Filled 2020-05-26: qty 30

## 2020-05-26 MED ORDER — MEASLES, MUMPS & RUBELLA VAC IJ SOLR: 0.5000 mL | Freq: Once | INTRAMUSCULAR | Status: DC

## 2020-05-26 MED ORDER — OXYCODONE-ACETAMINOPHEN 5-325 MG PO TABS: 2.0000 | ORAL_TABLET | ORAL | Status: DC | PRN

## 2020-05-26 MED ORDER — LACTATED RINGERS IV SOLN: 500.0000 mL | INTRAVENOUS | Status: DC | PRN

## 2020-05-26 MED ORDER — LIDOCAINE HCL (PF) 1 % IJ SOLN
INTRAMUSCULAR | Status: AC
  Administered 2020-05-26: 30 mL via SUBCUTANEOUS
  Filled 2020-05-26: qty 30

## 2020-05-26 MED ORDER — FENTANYL-BUPIVACAINE-NACL 0.5-0.125-0.9 MG/250ML-% EP SOLN: 12.0000 mL/h | EPIDURAL | Status: DC | PRN

## 2020-05-26 MED ORDER — OXYTOCIN BOLUS FROM INFUSION
333.0000 mL | Freq: Once | INTRAVENOUS | Status: AC
  Administered 2020-05-26: 333 mL via INTRAVENOUS

## 2020-05-26 MED ORDER — WITCH HAZEL-GLYCERIN EX PADS
1.0000 "application " | MEDICATED_PAD | CUTANEOUS | Status: DC | PRN
  Administered 2020-05-26: 1 via TOPICAL

## 2020-05-26 MED ORDER — LACTATED RINGERS IV SOLN: 500.0000 mL | Freq: Once | INTRAVENOUS | Status: DC

## 2020-05-26 MED ORDER — LEVOTHYROXINE SODIUM 75 MCG PO TABS
75.0000 ug | ORAL_TABLET | Freq: Every day | ORAL | Status: DC
  Administered 2020-05-26 – 2020-05-27 (×2): 75 ug via ORAL
  Filled 2020-05-26 (×2): qty 1

## 2020-05-26 MED ORDER — OXYTOCIN-SODIUM CHLORIDE 30-0.9 UT/500ML-% IV SOLN
2.5000 [IU]/h | INTRAVENOUS | Status: DC
  Filled 2020-05-26: qty 500

## 2020-05-26 MED ORDER — SOD CITRATE-CITRIC ACID 500-334 MG/5ML PO SOLN: 30.0000 mL | ORAL | Status: DC | PRN

## 2020-05-26 MED ORDER — LIDOCAINE HCL (PF) 1 % IJ SOLN: 30.0000 mL | INTRAMUSCULAR | Status: AC | PRN

## 2020-05-26 MED ORDER — DIPHENHYDRAMINE HCL 50 MG/ML IJ SOLN: 12.5000 mg | INTRAMUSCULAR | Status: DC | PRN

## 2020-05-26 MED ORDER — ONDANSETRON HCL 4 MG/2ML IJ SOLN: 4.0000 mg | INTRAMUSCULAR | Status: DC | PRN

## 2020-05-26 MED ORDER — ZOLPIDEM TARTRATE 5 MG PO TABS: 5.0000 mg | ORAL_TABLET | Freq: Every evening | ORAL | Status: DC | PRN

## 2020-05-26 MED ORDER — OXYCODONE-ACETAMINOPHEN 5-325 MG PO TABS: 1.0000 | ORAL_TABLET | ORAL | Status: DC | PRN

## 2020-05-26 NOTE — H&P (Signed)
Theodore Scott is an 39 y.o. G2P0010 Unknown adult.   Chief Complaint: contractions since 9 pm HPI: Term labor. Contractions started this evening after day at [place].  Past Medical History:  Diagnosis Date  . Anxiety   . Arthritis   . Depression   . Ectopic heartbeat   . Hypothyroidism   . PCOS (polycystic ovarian syndrome)   . Sleep apnea     Past Surgical History:  Procedure Laterality Date  . CHOLECYSTECTOMY    . MODIFIED BROSTROM PROCEDURE Right 2016    Family History  Problem Relation Age of Onset  . Hypertension Mother   . Atrial fibrillation Mother   . Depression Mother   . Anxiety disorder Mother   . Thyroid disease Mother   . Sleep apnea Mother   . Hyperlipidemia Father   . Hypertension Father   . Depression Father   . Anxiety disorder Father   . Diabetes Father   . Sleep apnea Father   . Bipolar disorder Sister   . Anxiety disorder Sister   . Thyroid disease Maternal Aunt   . Diabetes Paternal Aunt   . Diabetes Paternal Uncle   . Heart attack Paternal Uncle   . Lupus Maternal Grandmother   . Thyroid disease Maternal Grandmother   . Cancer Paternal Grandmother        breast  . Diabetes Paternal Grandmother   . Diabetes Paternal Grandfather    Social History:  reports that he has never smoked. He has never used smokeless tobacco. He reports previous alcohol use. He reports that he does not use drugs.    Allergies  Allergen Reactions  . Vicodin [Hydrocodone-Acetaminophen] Other (See Comments)    Pt does not like how she feels "shaky, panicky" sensation    Medications Prior to Admission  Medication Sig Dispense Refill  . acetaminophen (TYLENOL) 500 MG tablet Take 1,000 mg by mouth every 6 (six) hours as needed.    . cholecalciferol (VITAMIN D3) 25 MCG (1000 UT) tablet Take 1,000 Units by mouth daily.    . diclofenac sodium (VOLTAREN) 1 % GEL Apply 4 g topically 4 (four) times daily. 150 g 1  . doxylamine, Sleep, (UNISOM) 25 MG tablet Take 25 mg  by mouth at bedtime as needed.    Marland Kitchen levothyroxine (SYNTHROID) 75 MCG tablet TAKE 1 TABLET BY MOUTH DAILY. 90 tablet 1  . metFORMIN (GLUCOPHAGE-XR) 500 MG 24 hr tablet TAKE 1 TABLET BY MOUTH DAILY WITH BREAKFAST 90 tablet 0  . Prenatal Vit-Fe Fumarate-FA (MULTIVITAMIN-PRENATAL) 27-0.8 MG TABS tablet Take 1 tablet by mouth daily at 12 noon.    . vitamin B-6 (PYRIDOXINE) 25 MG tablet Take 25 mg by mouth daily.       A comprehensive review of systems was negative.  Blood pressure 100/80, pulse 71, temperature 97.8 F (36.6 C), temperature source Oral, resp. rate 17, height 5\' 9"  (1.753 m), weight 117 kg, last menstrual period 08/31/2019, SpO2 98 %, unknown if currently breastfeeding. General appearance: alert, cooperative and appears stated age Head: Normocephalic, without obvious abnormality, atraumatic Neck: no adenopathy, supple, symmetrical, trachea midline and thyroid not enlarged, symmetric, no tenderness/mass/nodules Lungs: normal effort Heart: regular rate and rhythm Abdomen: soft, non-tender; bowel sounds normal; no masses,  no organomegaly Extremities: Homans sign is negative, no sign of DVT Skin: Skin color, texture, turgor normal. No rashes or lesions Neurologic: Grossly normal   Lab Results  Component Value Date   WBC 15.5 (H) 05/25/2020   HGB 12.6 05/25/2020  HCT 37.2 05/25/2020   MCV 94.9 05/25/2020   PLT 184 05/25/2020         ABO, Rh: --/--/A POS (08/28 2329)  Antibody: NEG (08/28 2329)  Rubella:   immune RPR:   non-reactive HBsAg:   Neg HIV:   Neg GBS:   Neg    Assessment/Plan Principal Problem:   Labor and delivery indication for care or intervention  Patient is in active labor Plan for unmedicated birth. GBS negative.  Theodore Scott 05/26/2020, 2:29 AM

## 2020-05-26 NOTE — Anesthesia Preprocedure Evaluation (Deleted)
Anesthesia Evaluation  Patient identified by MRN, date of birth, ID band Patient awake    Reviewed: Allergy & Precautions, Patient's Chart, lab work & pertinent test results  Airway Mallampati: II  TM Distance: >3 FB Neck ROM: Full    Dental no notable dental hx. (+) Teeth Intact   Pulmonary sleep apnea and Continuous Positive Airway Pressure Ventilation ,    Pulmonary exam normal breath sounds clear to auscultation       Cardiovascular negative cardio ROS Normal cardiovascular exam Rhythm:Regular Rate:Normal     Neuro/Psych PSYCHIATRIC DISORDERS Anxiety Depression negative neurological ROS     GI/Hepatic Neg liver ROS, GERD  Medicated,  Endo/Other  Hypothyroidism Obesity PCOS  Renal/GU negative Renal ROS  negative genitourinary   Musculoskeletal  (+) Arthritis ,   Abdominal (+) + obese,   Peds  Hematology   Anesthesia Other Findings   Reproductive/Obstetrics (+) Pregnancy                             Anesthesia Physical Anesthesia Plan  ASA: II  Anesthesia Plan: Epidural   Post-op Pain Management:    Induction:   PONV Risk Score and Plan: Treatment may vary due to age or medical condition  Airway Management Planned: Natural Airway  Additional Equipment:   Intra-op Plan:   Post-operative Plan:   Informed Consent: I have reviewed the patients History and Physical, chart, labs and discussed the procedure including the risks, benefits and alternatives for the proposed anesthesia with the patient or authorized representative who has indicated his/her understanding and acceptance.       Plan Discussed with: Anesthesiologist  Anesthesia Plan Comments: (Epidural not performed. Patient pushing and dilated to 10cm. )       Anesthesia Quick Evaluation

## 2020-05-26 NOTE — Discharge Instructions (Signed)
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
## 2020-05-26 NOTE — Lactation Note (Signed)
This note was copied from a baby's chart. Lactation Consultation Note  Patient Name: Theodore Scott WNIOE'V Date: 05/26/2020 Reason for consult: Initial assessment;Other (Comment);Term;Maternal endocrine disorder;Primapara;1st time breastfeeding (Transgender, [employer] employee) Type of Endocrine Disorder?: PCOS (and hypothyroid (on synthroid))  LC came back to complete assessment. Birthing parent is transgender, genetic male but wants to be called "Theodore Scott" or "dad". He has a Hx of PCOS and hypothyroid, but he's on synthroid. However this doesn't seem to have affected his milk supply, as he was able to pump 8 ml on her first pumping session, praised him for his efforts.  Theodore Scott is a [employer] and he inquired about a pump since his plan is doing just pumping and bottle feeding. Support person/"mom" told LC he doesn't feel comfortable with any touching or hand expression, let them know that we'll do whatever they need to have lactation support whether that is doing/not doing hands on.   RN Meriam Sprague has already set up a DEBP. LC provided more colostrum bullets and educated parents on cleaning pump. Reviewed normal newborn behavior, cluster feeding, feeding cues, size of baby's stomach, supplementation guidelines and lactogenesis II.  Feeding plan:  1. Encouraged Theodore Scott to start pumping every 3 hours; at least 8 pumping sessions in 24 hours if family goal is to establish a human milk diet. Parents aware to feed baby on feeding cues 8-12 times/24 hours. 2. [employer] employee breast pump form given, he'll let her RN know when he's ready to pick up his pump  BF brochure, BF resources, feeding diary and supplementation handout given. Parents reported all questions and concerns were answered, they're both aware of LC OP services and will call PRN.   Maternal Data Formula Feeding for Exclusion: Yes Reason for exclusion: Mother's choice to formula and breast feed on admission Has patient been  taught Hand Expression?: No Does the patient have breastfeeding experience prior to this delivery?: No  Feeding Feeding Type: Breast Milk  LATCH Score                   Interventions Interventions: Breast feeding basics reviewed;Coconut oil;DEBP  Lactation Tools Discussed/Used Tools: Pump;Coconut oil Breast pump type: Double-Electric Breast Pump WIC Program: No Pump Review: Setup, frequency, and cleaning;Milk Storage Initiated by:: RN and LC (breastmilk storage guidelines) Date initiated:: 05/26/20   Consult Status Consult Status: Follow-up Date: 05/27/20 Follow-up type: In-patient    Felicie Kocher Venetia Constable 05/26/2020, 12:42 PM

## 2020-05-26 NOTE — MAU Note (Addendum)
Pt came in to MAU laboring and was assisted to a room by RN  with family ( wife and child) after refusing to come back without family.. The patient was very uncomfortable so I explained I needed to check his cervix as soon as possible Pt was punching counter top. I checked at 6 cm dilated. I explained that we needed to get orders along with lab work, while also getting his health information as soon as we could.  Darlina Rumpf RN and Danielle RN assisted at bedside. After orders and information was collected. The patient was requesting to stay in MAU with his wife until someone could resume care of child. Cervix rechecked due to patient discomfort. 8 cm dialated. I advised we take him to Labor and delivery   Idalia Needle, house coverage,advised that we could make accomodation for the child to come up with parents until he was picked up. The patient refused Dr. Vincente Poli and requested to be seen by faculty midwife. The  patient was transferred to Labor and delivery by Si Raider who notified Labor nurse of patient refusal to see Dr. Vincente Poli.

## 2020-05-26 NOTE — Progress Notes (Signed)
Pt has requested to limit staff entering room as little as possible throughout the night. Plan of care discussed with pt and pt would like staff to wait until 1am to come into the room when the newborn gets labs drawn. RN discussed with pt to call out if any needs arise, pt verbalized agreement.

## 2020-05-26 NOTE — Discharge Summary (Signed)
   Postpartum Discharge Summary  Date of Service updated 05/27/20     Patient Name: Theodore Scott DOB: 11/18/1980 MRN: [id number]  Date of admission: 05/25/2020 Delivery date:05/26/2020  Delivering provider: PRATT, TANYA S  Date of discharge: 05/27/2020  Admitting diagnosis: Labor and delivery indication for care or intervention [O75.9] Intrauterine pregnancy: Unknown     Secondary diagnosis:  Principal Problem:   Labor and delivery indication for care or intervention Active Problems:   Vaginal delivery   Hypothyroid   Transgender   Second degree perineal laceration  Additional problems: none    Discharge diagnosis: Term Pregnancy Delivered                                              Post partum procedures:none Augmentation: N/A Complications: None  Hospital course: Onset of Labor With Vaginal Delivery      38 y.o. yo G2P0010 at Unknown was admitted in Active Labor on 05/25/2020. Patient had an uncomplicated labor course as follows:  Membrane Rupture Time/Date: 12:33 AM ,05/26/2020   Delivery Method:Vaginal, Spontaneous  Episiotomy: None  Lacerations:  2nd degree  Patient had an uncomplicated postpartum course.  She is ambulating, tolerating a regular diet, passing flatus, and urinating well. Patient is discharged home in stable condition on 05/27/20.  Newborn Data: Birth date:05/26/2020  Birth time:12:49 AM  Gender:Male  Living status:Living  Apgars: , not recorded Weight:3629 g   Magnesium Sulfate received: No BMZ received: No Rhophylac:N/A MMR:N/A T-DaP:Given prenatally Flu: N/A Transfusion:No  Physical exam  Vitals:   05/26/20 0510 05/26/20 1000 05/26/20 1730 05/26/20 1950  BP: 112/65 115/71 118/68 121/61  Pulse: 70 70 71 67  Resp: 16 18 18 18  Temp: 97.7 F (36.5 C) 97.7 F (36.5 C) 98.5 F (36.9 C) 98 F (36.7 C)  TempSrc: Oral Oral Oral Oral  SpO2: 99% 100% 100% 99%  Weight:      Height:       General: alert, cooperative and no  distress Lochia: appropriate Uterine Fundus: firm Incision: N/A DVT Evaluation: No evidence of DVT seen on physical exam. Labs: Lab Results  Component Value Date   WBC 15.5 (H) 05/25/2020   HGB 12.6 05/25/2020   HCT 37.2 05/25/2020   MCV 94.9 05/25/2020   PLT 184 05/25/2020   CMP Latest Ref Rng & Units 08/18/2019  Glucose 70 - 99 mg/dL 103(H)  BUN 6 - 23 mg/dL 18  Creatinine 0.40 - 1.20 mg/dL 0.79  Sodium 135 - 145 mEq/L 140  Potassium 3.5 - 5.1 mEq/L 4.2  Chloride 96 - 112 mEq/L 103  CO2 19 - 32 mEq/L 29  Calcium 8.4 - 10.5 mg/dL 9.5  Total Protein 6.0 - 8.3 g/dL 7.1  Total Bilirubin 0.2 - 1.2 mg/dL 0.4  Alkaline Phos 39 - 117 U/L 40  AST 0 - 37 U/L 15  ALT 0 - 35 U/L 16   Edinburgh Score: Edinburgh Postnatal Depression Scale Screening Tool 05/26/2020  I have been able to laugh and see the funny side of things. (No Data)     After visit meds:  Allergies as of 05/27/2020      Reactions   Vicodin [hydrocodone-acetaminophen] Other (See Comments)   Pt does not like how she feels "shaky, panicky" sensation      Medication List    TAKE these medications   acetaminophen 500 MG tablet   Commonly known as: TYLENOL Take 1,000 mg by mouth every 6 (six) hours as needed.   cholecalciferol 25 MCG (1000 UNIT) tablet Commonly known as: VITAMIN D3 Take 1,000 Units by mouth daily.   diclofenac sodium 1 % Gel Commonly known as: VOLTAREN Apply 4 g topically 4 (four) times daily.   doxylamine (Sleep) 25 MG tablet Commonly known as: UNISOM Take 25 mg by mouth at bedtime as needed.   ibuprofen 600 MG tablet Commonly known as: ADVIL Take 1 tablet (600 mg total) by mouth every 6 (six) hours.   levothyroxine 75 MCG tablet Commonly known as: SYNTHROID TAKE 1 TABLET BY MOUTH DAILY.   metFORMIN 500 MG 24 hr tablet Commonly known as: GLUCOPHAGE-XR TAKE 1 TABLET BY MOUTH DAILY WITH BREAKFAST   multivitamin-prenatal 27-0.8 MG Tabs tablet Take 1 tablet by mouth daily at 12  noon.   vitamin B-6 25 MG tablet Commonly known as: pyridOXINE Take 25 mg by mouth daily.        Discharge home in stable condition Infant Feeding: Breast(pumping) Infant Disposition:home with mother Discharge instruction: per After Visit Summary and Postpartum booklet. Activity: Advance as tolerated. Pelvic rest for 6 weeks.  Diet: routine diet Future Appointments: Future Appointments  Date Time Provider Department Center  05/28/2020  9:30 AM [facility] None   Follow up Visit: Patient is a [facility] Patient, will call their office and see if he can get a postpartum appt with them. If they will not schedule his postpartum appt he will be seen at [facility].    Please schedule this patient for a In person postpartum visit in 4 weeks with the following provider: Any provider. Additional Postpartum F/U:none  Low risk pregnancy complicated by: none\ Delivery mode:  Vaginal, Spontaneous  Anticipated Birth Control:  partner is male   05/27/2020  C , MD   

## 2020-05-27 DIAGNOSIS — Z789 Other specified health status: Secondary | ICD-10-CM | POA: Diagnosis present

## 2020-05-27 DIAGNOSIS — E039 Hypothyroidism, unspecified: Secondary | ICD-10-CM | POA: Diagnosis present

## 2020-05-27 MED ORDER — IBUPROFEN 600 MG PO TABS: 600.0000 mg | ORAL_TABLET | Freq: Four times a day (QID) | ORAL | 0 refills | Status: DC
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
## 2020-05-28 ENCOUNTER — Other Ambulatory Visit (HOSPITAL_COMMUNITY): Payer: No Typology Code available for payment source

## 2020-05-30 ENCOUNTER — Inpatient Hospital Stay (HOSPITAL_COMMUNITY): Payer: No Typology Code available for payment source

## 2020-05-30 ENCOUNTER — Inpatient Hospital Stay (HOSPITAL_COMMUNITY)
Admission: AD | Admit: 2020-05-30 | Payer: No Typology Code available for payment source | Source: Home / Self Care | Admitting: Obstetrics and Gynecology

## 2020-06-04 ENCOUNTER — Encounter: Payer: Self-pay | Admitting: Family Medicine

## 2020-06-07 ENCOUNTER — Encounter: Payer: Self-pay | Admitting: Family Medicine

## 2020-06-07 ENCOUNTER — Ambulatory Visit (INDEPENDENT_AMBULATORY_CARE_PROVIDER_SITE_OTHER): Payer: No Typology Code available for payment source | Admitting: Family Medicine

## 2020-06-07 ENCOUNTER — Other Ambulatory Visit: Payer: Self-pay

## 2020-06-07 NOTE — Assessment & Plan Note (Signed)
It hasn't even been 2 weeks since delivery but pt wants to return to work.  Denies post-partum sxs but feels works completes who he is.  Baby has child care and pt is able to return to work w/ lifting restrictions.  I asked if pt was sure he wanted to give up this bonding time and recovery time and he states he is sure.  Letter provided to return to work.

## 2020-06-07 NOTE — Patient Instructions (Signed)
Follow up as needed or as scheduled Please listen to your body and take it slow when you return to work Call with any questions or concerns CONGRATS!

## 2020-06-07 NOTE — Progress Notes (Signed)
   Subjective:    Patient ID: Theodore Scott, adult    DOB: Oct 09, 1980, 39 y.o.   MRN: [id number]  HPI Follow up- pt had vaginal delivery on 8/29.  Pt is pumping.  Pt wants to return to work.  Pt reports he 'enjoys work' and 'it makes me feel more like myself'.  He and spouse work opposite shifts so baby is cared for.  Denies any depression or thoughts of self harm or injury to baby.  Vaginal pain has improved.  Bleeding has slowed to pink discharge.  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   Review of Systems For ROS see HPI   This visit occurred during the SARS-CoV-2 public health emergency.  Safety protocols were in place, including screening questions prior to the visit, additional usage of staff PPE, and extensive cleaning of exam room while observing appropriate contact time as indicated for disinfecting solutions.       Objective:   Physical Exam Vitals reviewed.  Constitutional:      Appearance: Normal appearance.  HENT:     Head: Normocephalic and atraumatic.  Musculoskeletal:     Right lower leg: Edema present.     Left lower leg: Edema present.  Skin:    General: Skin is warm and dry.  Neurological:     General: No focal deficit present.     Mental Status: He is alert and oriented to person, place, and time.  Psychiatric:        Mood and Affect: Mood normal.        Behavior: Behavior normal.        Thought Content: Thought content normal.           Assessment & Plan:

## 2020-06-12 ENCOUNTER — Inpatient Hospital Stay (HOSPITAL_COMMUNITY)
Admission: AD | Admit: 2020-06-12 | Discharge: 2020-06-13 | Disposition: A | Discharge status: Home / Self Care | Payer: No Typology Code available for payment source | Attending: Obstetrics and Gynecology | Admitting: Obstetrics and Gynecology

## 2020-06-12 ENCOUNTER — Other Ambulatory Visit: Payer: Self-pay

## 2020-06-12 DIAGNOSIS — E039 Hypothyroidism, unspecified: Secondary | ICD-10-CM | POA: Insufficient documentation

## 2020-06-12 DIAGNOSIS — Z885 Allergy status to narcotic agent status: Secondary | ICD-10-CM | POA: Insufficient documentation

## 2020-06-12 DIAGNOSIS — O9089 Other complications of the puerperium, not elsewhere classified: Secondary | ICD-10-CM | POA: Insufficient documentation

## 2020-06-12 DIAGNOSIS — R102 Pelvic and perineal pain: Secondary | ICD-10-CM | POA: Insufficient documentation

## 2020-06-12 DIAGNOSIS — Z7984 Long term (current) use of oral hypoglycemic drugs: Secondary | ICD-10-CM | POA: Insufficient documentation

## 2020-06-12 DIAGNOSIS — G4730 Sleep apnea, unspecified: Secondary | ICD-10-CM | POA: Insufficient documentation

## 2020-06-12 DIAGNOSIS — E282 Polycystic ovarian syndrome: Secondary | ICD-10-CM | POA: Insufficient documentation

## 2020-06-12 DIAGNOSIS — Z79890 Hormone replacement therapy (postmenopausal): Secondary | ICD-10-CM | POA: Insufficient documentation

## 2020-06-12 NOTE — MAU Note (Signed)
. °  Theodore Scott is a 39 y.o. at postpartum vaginal delivery (05/26/20)  here in MAU reporting: Abdominal pain that started at 1900 tonight. It radiates to pelvis. Also has vaginal bleeding that has increased today. States that there were a few small clots. He states that it feels full like there may be a bladder infection.   Pain score: 5 Vitals:   06/12/20 2352  BP: 121/72  Pulse: (!) 58  Resp: 15  Temp: 97.9 F (36.6 C)  SpO2: 100%

## 2020-06-13 ENCOUNTER — Encounter (HOSPITAL_COMMUNITY): Payer: Self-pay | Admitting: Obstetrics and Gynecology

## 2020-06-13 DIAGNOSIS — R102 Pelvic and perineal pain: Secondary | ICD-10-CM | POA: Diagnosis not present

## 2020-06-13 DIAGNOSIS — Z7984 Long term (current) use of oral hypoglycemic drugs: Secondary | ICD-10-CM | POA: Diagnosis not present

## 2020-06-13 DIAGNOSIS — E282 Polycystic ovarian syndrome: Secondary | ICD-10-CM | POA: Diagnosis not present

## 2020-06-13 DIAGNOSIS — R252 Cramp and spasm: Secondary | ICD-10-CM

## 2020-06-13 DIAGNOSIS — O9089 Other complications of the puerperium, not elsewhere classified: Secondary | ICD-10-CM | POA: Diagnosis not present

## 2020-06-13 DIAGNOSIS — Z885 Allergy status to narcotic agent status: Secondary | ICD-10-CM | POA: Diagnosis not present

## 2020-06-13 DIAGNOSIS — Z79890 Hormone replacement therapy (postmenopausal): Secondary | ICD-10-CM | POA: Diagnosis not present

## 2020-06-13 DIAGNOSIS — E039 Hypothyroidism, unspecified: Secondary | ICD-10-CM | POA: Diagnosis not present

## 2020-06-13 DIAGNOSIS — G4730 Sleep apnea, unspecified: Secondary | ICD-10-CM | POA: Diagnosis not present

## 2020-06-13 LAB — URINALYSIS, ROUTINE W REFLEX MICROSCOPIC
Bilirubin Urine: NEGATIVE
Glucose, UA: NEGATIVE mg/dL
Ketones, ur: NEGATIVE mg/dL
Nitrite: NEGATIVE
Protein, ur: 30 mg/dL — AB
RBC / HPF: >50 RBC/hpf — ABNORMAL HIGH (ref 0–5)
Specific Gravity, Urine: 1.024 (ref 1.005–1.030)
WBC, UA: >50 WBC/hpf — ABNORMAL HIGH (ref 0–5)
pH: 5 (ref 5.0–8.0)

## 2020-06-13 LAB — CBC
HCT: 32.7 % — ABNORMAL LOW (ref 36.0–46.0)
Hemoglobin: 10.4 g/dL — ABNORMAL LOW (ref 12.0–15.0)
MCH: 30.8 pg (ref 26.0–34.0)
MCHC: 31.8 g/dL (ref 30.0–36.0)
MCV: 96.7 fL (ref 80.0–100.0)
Platelets: 229 10*3/uL (ref 150–400)
RBC: 3.38 MIL/uL — ABNORMAL LOW (ref 3.87–5.11)
RDW: 12.8 % (ref 11.5–15.5)
WBC: 8.1 10*3/uL (ref 4.0–10.5)
nRBC: 0 % (ref 0.0–0.2)

## 2020-06-13 NOTE — MAU Provider Note (Signed)
Chief Complaint:  Abdominal Pain and Vaginal Bleeding   First Provider Initiated Contact with Patient 06/13/20 0026      HPI: Theodore Scott is a 39 y.o. G2P0010 who is 2.5 weeks postpartum presents to maternity admissions reporting increased vaginal bleeding (lochia) and uterine cramping tonight.  After deliver, had bled very little  Is worried there is infection or something wrong.  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    Abdominal Pain This is a new problem. The current episode started today. The problem occurs intermittently. The problem has been unchanged. The pain is located in the suprapubic region, LLQ and RLQ. The quality of the pain is cramping. The abdominal pain does not radiate. Pertinent negatives include no constipation, diarrhea, dysuria, fever, frequency, myalgias, nausea or vomiting. Nothing aggravates the pain. The pain is relieved by nothing. Treatments tried: ibuprofen. The treatment provided mild relief.  Vaginal Bleeding This is a recurrent problem. The current episode started today. The problem occurs intermittently. The problem has been unchanged. Associated symptoms include abdominal pain. Pertinent negatives include no chills, fever, myalgias, nausea or vomiting. Nothing aggravates the symptoms. He has tried nothing for the symptoms.   RN note: Theodore Scott is a 39 y.o. at postpartum vaginal delivery (05/26/20)  here in MAU reporting: Abdominal pain that started at 1900 tonight. It radiates to pelvis. Also has vaginal bleeding that has increased today. States that there were a few small clots. He states that it feels full like there may be a bladder infection.   Past Medical History: Past Medical History:  Diagnosis Date   Anxiety    Arthritis    Depression    Ectopic heartbeat    Hypothyroidism     PCOS (polycystic ovarian syndrome)    Sleep apnea     Past obstetric history: OB History  Gravida Para Term Preterm AB Living  2       1    SAB TAB Ectopic Multiple Live Births  1            # Outcome Date GA Lbr Len/2nd Weight Sex Delivery Anes PTL Lv  2 Gravida           1 SAB 05/2019            Past Surgical History: Past Surgical History:  Procedure Laterality Date   CHOLECYSTECTOMY     MODIFIED BROSTROM PROCEDURE Right 2016    Family History: Family History  Problem Relation Age of Onset   Hypertension Mother    Atrial fibrillation Mother    Depression Mother    Anxiety disorder Mother    Thyroid disease Mother    Sleep apnea Mother    Hyperlipidemia Father    Hypertension Father    Depression Father    Anxiety disorder Father    Diabetes Father    Sleep apnea Father    Bipolar disorder Sister    Anxiety disorder Sister    Thyroid disease Maternal Aunt    Diabetes Paternal Aunt    Diabetes Paternal Uncle    Heart attack Paternal Uncle    Lupus Maternal Grandmother    Thyroid disease Maternal Grandmother    Cancer Paternal Grandmother        breast   Diabetes Paternal Grandmother    Diabetes Paternal Grandfather     Social History: Social History  Tobacco Use   Smoking status: Never Smoker   Smokeless tobacco: Never Used  Vaping Use   Vaping Use: Never used  Substance Use Topics   Alcohol use: Not Currently   Drug use: No    Allergies:  Allergies  Allergen Reactions   Vicodin [Hydrocodone-Acetaminophen] Other (See Comments)    Pt does not like how she feels "shaky, panicky" sensation    Meds:  Medications Prior to Admission  Medication Sig Dispense Refill Last Dose   acetaminophen (TYLENOL) 500 MG tablet Take 1,000 mg by mouth every 6 (six) hours as needed for mild pain or headache.    Past Month at Unknown time   CALCIUM PO Take 1 tablet by mouth daily.   06/11/2020 at Unknown time    cholecalciferol (VITAMIN D3) 25 MCG (1000 UT) tablet Take 1,000 Units by mouth daily.   06/11/2020 at Unknown time   Ferrous Sulfate (IRON PO) Take 1 tablet by mouth daily.   06/11/2020 at Unknown time   ibuprofen (ADVIL) 600 MG tablet Take 1 tablet (600 mg total) by mouth every 6 (six) hours. 30 tablet 0 06/12/2020 at 2030   Prenatal Vit-Fe Fumarate-FA (MULTIVITAMIN-PRENATAL) 27-0.8 MG TABS tablet Take 1 tablet by mouth daily at 12 noon.   06/11/2020 at Unknown time   diclofenac sodium (VOLTAREN) 1 % GEL Apply 4 g topically 4 (four) times daily. (Patient not taking: Reported on 06/07/2020) 150 g 1    levothyroxine (SYNTHROID) 75 MCG tablet TAKE 1 TABLET BY MOUTH DAILY. (Patient taking differently: Take 75 mcg by mouth daily before breakfast. ) 90 tablet 1    metFORMIN (GLUCOPHAGE-XR) 500 MG 24 hr tablet TAKE 1 TABLET BY MOUTH DAILY WITH BREAKFAST 90 tablet 0     I have reviewed patient's Past Medical Hx, Surgical Hx, Family Hx, Social Hx, medications and allergies.  ROS:  Review of Systems  Constitutional: Negative for chills and fever.  Gastrointestinal: Positive for abdominal pain. Negative for constipation, diarrhea, nausea and vomiting.  Genitourinary: Positive for vaginal bleeding. Negative for dysuria and frequency.  Musculoskeletal: Negative for myalgias.   Other systems negative     Physical Exam   Patient Vitals for the past 24 hrs:  BP Temp Temp src Pulse Resp SpO2  06/12/20 2352 121/72 97.9 F (36.6 C) Oral (!) 58 15 100 %   Constitutional: Well-developed, well-nourished male in no acute distress.  Cardiovascular: normal rate and rhythm, no ectopy audible, S1 & S2 heard, no murmur Respiratory: normal effort, no distress. Lungs CTAB with no wheezes or crackles GI: Abd soft, non-tender.  Nondistended.  No rebound, No guarding.    MS: Extremities nontender, no edema, normal ROM Neurologic: Alert and oriented x 4.   Grossly nonfocal. GU: Neg CVAT. Skin:  Warm and  Dry Psych:  Affect appropriate.  PELVIC EXAM: declines internal exam                           External palpation reveals involuted uterus, slightly tender   Labs: Results for orders placed or performed during the hospital encounter of 06/12/20 (from the past 24 hour(s))  Urinalysis, Routine w reflex microscopic Urine, Clean Catch     Status: Abnormal   Collection Time: 06/13/20 12:32 AM  Result Value Ref Range   Color, Urine YELLOW YELLOW   APPearance HAZY (A) CLEAR   Specific Gravity, Urine 1.024 1.005 - 1.030   pH 5.0 5.0 - 8.0   Glucose, UA NEGATIVE  NEGATIVE mg/dL   Hgb urine dipstick LARGE (A) NEGATIVE   Bilirubin Urine NEGATIVE NEGATIVE   Ketones, ur NEGATIVE NEGATIVE mg/dL   Protein, ur 30 (A) NEGATIVE mg/dL   Nitrite NEGATIVE NEGATIVE   Leukocytes,Ua LARGE (A) NEGATIVE   RBC / HPF >50 (H) 0 - 5 RBC/hpf   WBC, UA >50 (H) 0 - 5 WBC/hpf   Bacteria, UA RARE (A) NONE SEEN   Squamous Epithelial / LPF 0-5 0 - 5   Mucus PRESENT   CBC     Status: Abnormal   Collection Time: 06/13/20 12:58 AM  Result Value Ref Range   WBC 8.1 4.0 - 10.5 K/uL   RBC 3.38 (L) 3.87 - 5.11 MIL/uL   Hemoglobin 10.4 (L) 12.0 - 15.0 g/dL   HCT 16.1 (L) 36 - 46 %   MCV 96.7 80.0 - 100.0 fL   MCH 30.8 26.0 - 34.0 pg   MCHC 31.8 30.0 - 36.0 g/dL   RDW 09.6 04.5 - 40.9 %   Platelets 229 150 - 400 K/uL   nRBC 0.0 0.0 - 0.2 %    --/--/A POS (08/28 2329)  Imaging:  No results found.  MAU Course/MDM: I have ordered labs as follows:  CBC and UA.  No leukocytosis.  Hgb stable.  Some leukocytes in urine, so sent for culture.  Discussed will treat if culture positive Imaging ordered: none Results reviewed.    Treatments in MAU included none.    Discussed we don't find overwhelming evidence of endometritis.  There may be a UTI but will wait for culture to determine that.  We do see increased bleeding and cramping with increase activity postpartum   May want to take a few days and rest.   Pt stable at  time of discharge.  Assessment: Postpartum x 2.5 weeks Increased lochia and cramping Leukocytes in urine  Plan: Discharge home Recommend increase hydration, rest periods, ibuprofen followup in office. States would like to continue care with our [facility] group.  Encouraged to return here or to other Urgent Care/ED if she develops worsening of symptoms, increase in pain, fever, or other concerning symptoms.   Wynelle Bourgeois CNM, MSN Certified Nurse-Midwife 06/13/2020 12:26 AM

## 2020-06-13 NOTE — Discharge Instructions (Signed)

## 2020-06-14 LAB — CULTURE, OB URINE: Culture: <10000 — AB

## 2020-06-18 ENCOUNTER — Encounter: Payer: Self-pay | Admitting: Family Medicine

## 2020-06-24 ENCOUNTER — Ambulatory Visit (INDEPENDENT_AMBULATORY_CARE_PROVIDER_SITE_OTHER): Payer: No Typology Code available for payment source

## 2020-06-24 ENCOUNTER — Other Ambulatory Visit: Payer: Self-pay

## 2020-06-24 DIAGNOSIS — Z23 Encounter for immunization: Secondary | ICD-10-CM | POA: Diagnosis not present

## 2020-06-27 ENCOUNTER — Ambulatory Visit: Payer: No Typology Code available for payment source | Admitting: Family Medicine

## 2020-06-28 ENCOUNTER — Other Ambulatory Visit: Payer: Self-pay

## 2020-06-28 ENCOUNTER — Encounter: Payer: Self-pay | Admitting: Family Medicine

## 2020-06-28 ENCOUNTER — Ambulatory Visit (INDEPENDENT_AMBULATORY_CARE_PROVIDER_SITE_OTHER): Payer: No Typology Code available for payment source | Admitting: Family Medicine

## 2020-06-28 NOTE — Progress Notes (Signed)
Post Partum Visit Note  Theodore Scott is a 39 y.o. G2P0010 transgender male who presents for a postpartum visit. He is one month postpartum following a vaginal of delivery 05/27/2020.  I have fully reviewed the prenatal and intrapartum course. The delivery was at 38 gestational weeks.  Anesthesia: natural. Postpartum course has been normal, returned to work 2 weeks ago.  Baby is doing well. Baby is feeding by breast/bottle. Bleeding not at this time. Bowel function is normal. Bladder function is normal. Patient is sexually active. Contraception method is none. Postpartum depression screening: negative.   The pregnancy intention screening data noted above was reviewed.  The patient elected to proceed with No Method - Other Reason.    Edinburgh Postnatal Depression Scale - 06/28/20 0843      Edinburgh Postnatal Depression Scale:  In the Past 7 Days   I have been able to laugh and see the funny side of things. 0    I have looked forward with enjoyment to things. 0    I have blamed myself unnecessarily when things went wrong. 0    I have been anxious or worried for no good reason. 0    I have felt scared or panicky for no good reason. 0    Things have been getting on top of me. 0    I have been so unhappy that I have had difficulty sleeping. 0    I have felt sad or miserable. 0    I have been so unhappy that I have been crying. 0    The thought of harming myself has occurred to me. 0    Edinburgh Postnatal Depression Scale Total 0            The following portions of the patient's history were reviewed and updated as appropriate: allergies, current medications, past family history, past medical history, past social history, past surgical history and problem list.  Review of Systems Pertinent items noted in HPI and remainder of comprehensive ROS otherwise negative.    Objective:  Blood pressure 109/65, pulse 75, weight 232 lb (105.2 kg), last menstrual period 08/31/2019,  currently breastfeeding.  General:  alert, cooperative and appears stated age  Lungs: normal effort  Heart:  regular rate and rhythm  Abdomen: soft, non-tender; bowel sounds normal; no masses,  no organomegaly        Assessment:    Normal postpartum exam. Pap smear not done at today's visit.   Plan:   Essential components of care per ACOG recommendations:  1.  Mood and well being: Patient with negative depression screening today. Reviewed local resources for support.  - Patient does not use tobacco.   2. Infant care and feeding:  -Patient currently breastmilk feeding? Yes   3. Sexuality, contraception and birth spacing - Patient does want a pregnancy in the next year. He used IUI with clomid and injectable meds last time, would like to try this at home if possible. Will work on how to best accomplish this. - Discussed birth spacing of 18 months  4. Sleep and fatigue -Encouraged family/partner/community support of 4 hrs of uninterrupted sleep to help with mood and fatigue  5. Physical Recovery  - Discussed patient's delivery vaginal and complications - Patient had a 2nd degree laceration, perineal healing reviewed. Patient expressed understanding - Patient is safe to resume physical and sexual activity  6.  Health Maintenance - Last pap smear done 05/03/2018 and was normal with negative HPV.   Theodore Houseman  Tawni Levy, MD [facility] [employer], [facility]

## 2020-07-05 ENCOUNTER — Ambulatory Visit (HOSPITAL_BASED_OUTPATIENT_CLINIC_OR_DEPARTMENT_OTHER)
Admission: RE | Admit: 2020-07-05 | Discharge: 2020-07-05 | Disposition: A | Discharge status: Home / Self Care | Payer: No Typology Code available for payment source | Source: Ambulatory Visit | Attending: Family Medicine | Admitting: Family Medicine

## 2020-07-05 ENCOUNTER — Encounter: Payer: Self-pay | Admitting: Family Medicine

## 2020-07-05 ENCOUNTER — Ambulatory Visit (INDEPENDENT_AMBULATORY_CARE_PROVIDER_SITE_OTHER): Payer: No Typology Code available for payment source | Admitting: Family Medicine

## 2020-07-05 ENCOUNTER — Other Ambulatory Visit: Payer: Self-pay

## 2020-07-05 VITALS — BP 118/68 | HR 76 | Temp 98.0°F | Resp 16 | Ht 69.0 in | Wt 230.4 lb

## 2020-07-05 DIAGNOSIS — R2241 Localized swelling, mass and lump, right lower limb: Secondary | ICD-10-CM

## 2020-07-05 NOTE — Progress Notes (Signed)
   Subjective:    Patient ID: Theodore Scott, adult    DOB: 1981-08-30, 39 y.o.   MRN: [id number]  HPI R lateral leg mass- pt reports once her swelling from pregnancy disappeared she was left w/ a large, soft mass of R lateral leg.  TTP around outer edge but not direct pressure.  Was doing a playground build 2 weeks ago and was doing a lot of squatting and kneeling.     Review of Systems For ROS see HPI   This visit occurred during the SARS-CoV-2 public health emergency.  Safety protocols were in place, including screening questions prior to the visit, additional usage of staff PPE, and extensive cleaning of exam room while observing appropriate contact time as indicated for disinfecting solutions.       Objective:   Physical Exam Vitals reviewed.  Constitutional:      General: He is not in acute distress.    Appearance: Normal appearance. He is obese. He is not ill-appearing.  HENT:     Head: Normocephalic and atraumatic.  Cardiovascular:     Pulses: Normal pulses.  Musculoskeletal:        General: Tenderness (mild TTP around edges of mass circumferentially) and deformity (5 inch fluctuant subcutaneous soft tissue mass of R lateral lower leg, no TTP.  Palpable varicose vein superior to mass) present.  Skin:    General: Skin is warm and dry.     Findings: No erythema.  Neurological:     General: No focal deficit present.     Mental Status: He is alert and oriented to person, place, and time.  Psychiatric:        Mood and Affect: Mood normal.        Behavior: Behavior normal.        Thought Content: Thought content normal.           Assessment & Plan:

## 2020-07-05 NOTE — Patient Instructions (Signed)
We will get your [place] results and then determine the next steps Apply heat to see if we can some of that fluid to reabsorb Call with any questions or concerns Hang in there!!

## 2020-07-09 ENCOUNTER — Encounter: Payer: Self-pay | Admitting: Family Medicine

## 2020-07-09 DIAGNOSIS — R2241 Localized swelling, mass and lump, right lower limb: Secondary | ICD-10-CM

## 2020-07-24 ENCOUNTER — Encounter: Payer: Self-pay | Admitting: Family Medicine

## 2020-07-24 ENCOUNTER — Other Ambulatory Visit: Payer: Self-pay

## 2020-07-24 DIAGNOSIS — R2241 Localized swelling, mass and lump, right lower limb: Secondary | ICD-10-CM

## 2020-07-25 ENCOUNTER — Ambulatory Visit (INDEPENDENT_AMBULATORY_CARE_PROVIDER_SITE_OTHER): Payer: No Typology Code available for payment source

## 2020-07-25 ENCOUNTER — Ambulatory Visit (INDEPENDENT_AMBULATORY_CARE_PROVIDER_SITE_OTHER): Payer: No Typology Code available for payment source | Admitting: Orthopedic Surgery

## 2020-07-25 DIAGNOSIS — M79604 Pain in right leg: Secondary | ICD-10-CM

## 2020-07-25 DIAGNOSIS — R2241 Localized swelling, mass and lump, right lower limb: Secondary | ICD-10-CM

## 2020-07-26 ENCOUNTER — Encounter: Payer: Self-pay | Admitting: Orthopedic Surgery
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
## 2020-07-27 ENCOUNTER — Encounter: Payer: Self-pay | Admitting: Orthopedic Surgery

## 2020-07-27 DIAGNOSIS — R2241 Localized swelling, mass and lump, right lower limb: Secondary | ICD-10-CM | POA: Diagnosis not present

## 2020-07-27 MED ORDER — METHYLPREDNISOLONE ACETATE 40 MG/ML IJ SUSP
40.0000 mg | INTRAMUSCULAR | Status: AC | PRN
  Administered 2020-07-27: 40 mg via INTRA_ARTICULAR

## 2020-07-27 MED ORDER — BUPIVACAINE HCL 0.25 % IJ SOLN
4.0000 mL | INTRAMUSCULAR | Status: AC | PRN
  Administered 2020-07-27: 4 mL via INTRA_ARTICULAR

## 2020-07-27 MED ORDER — LIDOCAINE HCL 1 % IJ SOLN
5.0000 mL | INTRAMUSCULAR | Status: AC | PRN
  Administered 2020-07-27: 5 mL

## 2020-07-29 ENCOUNTER — Encounter: Payer: Self-pay | Admitting: Orthopedic Surgery

## 2020-07-30 NOTE — Telephone Encounter (Signed)
Dc wrap ok pls call

## 2020-08-06 ENCOUNTER — Other Ambulatory Visit: Payer: Self-pay

## 2020-08-06 ENCOUNTER — Encounter: Payer: Self-pay | Admitting: Orthopedic Surgery

## 2020-08-06 ENCOUNTER — Ambulatory Visit: Payer: Self-pay | Attending: Internal Medicine

## 2020-08-06 ENCOUNTER — Ambulatory Visit: Payer: Self-pay

## 2020-08-06 ENCOUNTER — Ambulatory Visit
Admission: RE | Admit: 2020-08-06 | Discharge: 2020-08-06 | Disposition: A | Discharge status: Home / Self Care | Payer: No Typology Code available for payment source | Source: Ambulatory Visit | Attending: Orthopedic Surgery | Admitting: Orthopedic Surgery

## 2020-08-06 DIAGNOSIS — Z23 Encounter for immunization: Secondary | ICD-10-CM

## 2020-08-06 DIAGNOSIS — M79604 Pain in right leg: Secondary | ICD-10-CM

## 2020-08-06 NOTE — Progress Notes (Signed)
   Covid-19 Vaccination Clinic  Name:  Theodore Scott    MRN: [id number] DOB: 02-Jan-1981  08/06/2020  Mr. Theodore Scott was observed post Covid-19 immunization for 15 minutes without incident. He was provided with Vaccine Information Sheet and instruction to access the V-Safe system.   Theodore Scott was instructed to call 911 with any severe reactions post vaccine: Marland Kitchen Difficulty breathing  . Swelling of face and throat  . A fast heartbeat  . A bad rash all over body  . Dizziness and weakness

## 2020-08-06 NOTE — Telephone Encounter (Signed)
This has been taken care of, she was approved before I saw this message.

## 2020-08-07 ENCOUNTER — Encounter (INDEPENDENT_AMBULATORY_CARE_PROVIDER_SITE_OTHER): Payer: Self-pay

## 2020-08-11 ENCOUNTER — Other Ambulatory Visit: Payer: No Typology Code available for payment source

## 2020-08-21 ENCOUNTER — Encounter: Payer: Self-pay | Admitting: Orthopedic Surgery

## 2020-08-21 ENCOUNTER — Ambulatory Visit (INDEPENDENT_AMBULATORY_CARE_PROVIDER_SITE_OTHER): Payer: No Typology Code available for payment source | Admitting: Orthopedic Surgery

## 2020-08-21 DIAGNOSIS — R2241 Localized swelling, mass and lump, right lower limb: Secondary | ICD-10-CM | POA: Diagnosis not present

## 2020-08-21 DIAGNOSIS — T148XXA Other injury of unspecified body region, initial encounter: Secondary | ICD-10-CM | POA: Diagnosis not present

## 2020-08-21 MED ORDER — LIDOCAINE HCL 1 % IJ SOLN
5.0000 mL | INTRAMUSCULAR | Status: AC | PRN
  Administered 2020-08-21: 5 mL
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
## 2020-08-22 LAB — APTT: aPTT: 25 s (ref 23–32)

## 2020-08-22 LAB — CBC WITH DIFFERENTIAL/PLATELET
Absolute Monocytes: 396 cells/uL (ref 200–950)
Basophils Absolute: 29 cells/uL (ref 0–200)
Basophils Relative: 0.4 %
Eosinophils Absolute: 72 cells/uL (ref 15–500)
Eosinophils Relative: 1 %
HCT: 37.6 % — ABNORMAL LOW (ref 38.5–50.0)
Hemoglobin: 12.1 g/dL — ABNORMAL LOW (ref 13.2–17.1)
Lymphs Abs: 1505 cells/uL (ref 850–3900)
MCH: 27.8 pg (ref 27.0–33.0)
MCHC: 32.2 g/dL (ref 32.0–36.0)
MCV: 86.2 fL (ref 80.0–100.0)
MPV: 11.3 fL (ref 7.5–12.5)
Monocytes Relative: 5.5 %
Neutro Abs: 5198 cells/uL (ref 1500–7800)
Neutrophils Relative %: 72.2 %
Platelets: 244 10*3/uL (ref 140–400)
RBC: 4.36 10*6/uL (ref 4.20–5.80)
RDW: 12.3 % (ref 11.0–15.0)
Total Lymphocyte: 20.9 %
WBC: 7.2 10*3/uL (ref 3.8–10.8)

## 2020-08-22 LAB — PROTIME-INR
INR: 1
Prothrombin Time: 11.2 s (ref 9.0–11.5)

## 2020-08-25 NOTE — Progress Notes (Signed)
Coags ok pls clala thx

## 2020-08-26 ENCOUNTER — Encounter: Payer: Self-pay | Admitting: Family Medicine

## 2020-08-26 DIAGNOSIS — R2241 Localized swelling, mass and lump, right lower limb: Secondary | ICD-10-CM

## 2020-08-30 ENCOUNTER — Inpatient Hospital Stay (HOSPITAL_COMMUNITY): Admission: RE | Admit: 2020-08-30 | Payer: No Typology Code available for payment source | Source: Ambulatory Visit

## 2020-09-02 ENCOUNTER — Other Ambulatory Visit: Payer: Self-pay

## 2020-09-03 ENCOUNTER — Encounter (HOSPITAL_COMMUNITY): Admission: RE | Payer: Self-pay | Source: Ambulatory Visit

## 2020-09-03 ENCOUNTER — Ambulatory Visit (HOSPITAL_COMMUNITY)
Admission: RE | Admit: 2020-09-03 | Payer: No Typology Code available for payment source | Source: Ambulatory Visit | Admitting: Orthopedic Surgery

## 2020-09-03 SURGERY — IRRIGATION AND DEBRIDEMENT KNEE
Anesthesia: General | Site: Knee | Laterality: Right

## 2020-09-11 ENCOUNTER — Inpatient Hospital Stay: Payer: No Typology Code available for payment source | Admitting: Orthopedic Surgery

## 2020-09-25 ENCOUNTER — Ambulatory Visit: Payer: No Typology Code available for payment source | Admitting: Family Medicine

## 2020-10-08 ENCOUNTER — Other Ambulatory Visit: Payer: Self-pay | Admitting: Family Medicine

## 2020-10-09 ENCOUNTER — Other Ambulatory Visit: Payer: Self-pay | Admitting: Family Medicine

## 2020-10-09 ENCOUNTER — Encounter: Payer: Self-pay | Admitting: Family Medicine

## 2020-10-09 NOTE — Telephone Encounter (Signed)
Needs appt with PCP

## 2020-10-14 ENCOUNTER — Telehealth: Payer: No Typology Code available for payment source | Admitting: Family Medicine

## 2020-10-14 ENCOUNTER — Encounter: Payer: Self-pay | Admitting: Family Medicine

## 2020-10-14 ENCOUNTER — Telehealth (INDEPENDENT_AMBULATORY_CARE_PROVIDER_SITE_OTHER): Payer: No Typology Code available for payment source | Admitting: Family Medicine

## 2020-10-14 ENCOUNTER — Other Ambulatory Visit: Payer: Self-pay | Admitting: Family Medicine

## 2020-10-14 DIAGNOSIS — E039 Hypothyroidism, unspecified: Secondary | ICD-10-CM | POA: Diagnosis not present

## 2020-10-14 DIAGNOSIS — M1711 Unilateral primary osteoarthritis, right knee: Secondary | ICD-10-CM

## 2020-10-14 DIAGNOSIS — R600 Localized edema: Secondary | ICD-10-CM

## 2020-10-14 MED ORDER — FUROSEMIDE 20 MG PO TABS: 20.0000 mg | ORAL_TABLET | Freq: Every day | ORAL | 1 refills | Status: DC

## 2020-10-14 MED ORDER — MELOXICAM 15 MG PO TABS: 15.0000 mg | ORAL_TABLET | Freq: Every day | ORAL | 1 refills | Status: DC

## 2020-10-14 NOTE — Progress Notes (Signed)
Virtual Visit via Video   I connected with patient on 10/14/20 at  8:00 AM EST by a video enabled telemedicine application and verified that I am speaking with the correct person using two identifiers.  Location patient: Home Location provider: [facility] Persons participating in the virtual visit: Patient, Provider, CMA (Sabrina M)  I discussed the limitations of evaluation and management by telemedicine and the availability of in person appointments. The patient expressed understanding and agreed to proceed.  Subjective:   HPI:   Swelling- 'I still have a lot of edema in my legs'.  Wearing compression socks, increased water intake, limiting sodium to 2400mg .  Has started jogging.  Continues to have swelling, R>L.  Swelling is not painful.  If swelling is severe legs will become itchy.  Swelling will resolve w/ elevation but returns daily.  His job requires him to be on his feet either 8 or 12 hrs daily.  Swelling worsens w/ prolonged standing.  Leg pain- previously had prescription for Meloxicam but stopped when attempting to conceive.  Asking for 90 day supply.  Hypothyroid- chronic problem.  On Levothyroxine daily.  Has not had thyroid checked since delivery of daughter 4 months ago  ROS:   See pertinent positives and negatives per HPI.  Patient Active Problem List   Diagnosis Date Noted  . Hypothyroid 05/27/2020  . Transgender 05/27/2020  . Obesity (BMI 30-39.9) 08/18/2019  . Patellofemoral arthritis of right knee 03/28/2019  . Primary osteoarthritis of right knee 03/28/2019  . GERD (gastroesophageal reflux disease) 01/31/2018  . Hypersomnia 01/01/2017    Social History   Tobacco Use  . Smoking status: Never Smoker  . Smokeless tobacco: Never Used  Substance Use Topics  . Alcohol use: Not Currently    Current Outpatient Medications:  .  acetaminophen (TYLENOL) 500 MG tablet, Take 1,000 mg by mouth every 6 (six) hours as needed for mild pain  or headache. , Disp: , Rfl:  .  ibuprofen (ADVIL) 200 MG tablet, Take 400-600 mg by mouth every 8 (eight) hours as needed (for pain.)., Disp: , Rfl:  .  levothyroxine (SYNTHROID) 75 MCG tablet, TAKE 1 TABLET BY MOUTH DAILY., Disp: 90 tablet, Rfl: 1 .  CALCIUM PO, Take 1 tablet by mouth at bedtime.  (Patient not taking: Reported on 10/14/2020), Disp: , Rfl:  .  cholecalciferol (VITAMIN D3) 25 MCG (1000 UT) tablet, Take 1,000 Units by mouth at bedtime.  (Patient not taking: Reported on 10/14/2020), Disp: , Rfl:  .  Ferrous Sulfate (IRON PO), Take 1 tablet by mouth at bedtime.  (Patient not taking: Reported on 10/14/2020), Disp: , Rfl:  .  Prenatal Vit-Fe Fumarate-FA (MULTIVITAMIN-PRENATAL) 27-0.8 MG TABS tablet, Take 1 tablet by mouth at bedtime.  (Patient not taking: Reported on 10/14/2020), Disp: , Rfl:   Allergies  Allergen Reactions  . Vicodin [Hydrocodone-Acetaminophen] Other (See Comments)    Pt does not like how she feels "shaky, panicky" sensation    Objective:   There were no vitals taken for this visit. AAOx3, NAD NCAT, EOMI No obvious CN deficits Coloring WNL Pt is able to speak clearly, coherently without shortness of breath or increased work of breathing.  1-2+ pitting edema of R lower leg, 1+ on L Thought process is linear.  Mood is appropriate.   Assessment and Plan:   LE edema- deteriorated.  Pt has 1-2+ pitting edema of bilateral LEs.  Has already limited sodium intake, increased water intake, is wearing compression socks.  Will  start Lasix for symptomatic relief and check labs to assess for underlying cause (anemia, electrolyte imbalance, etc).  If no improvement in swelling w/ Lasix will need vascular referral.  Pt expressed understanding and is in agreement w/ plan.   Knee pain- pt has hx of arthritis.  Would like to restart Meloxicam daily.  Prescription sent.  Hypothyroid- chronic problem.  Currently on Levothyroxine daily.  Has not had TSH checked since birth of  child 4 months ago.  Check labs.  Adjust meds prn    Neena Rhymes, MD 10/14/2020

## 2020-10-14 NOTE — Progress Notes (Signed)
I connected with  Theodore Scott on 10/14/20 by a video enabled telemedicine application and verified that I am speaking with the correct person using two identifiers.   I discussed the limitations of evaluation and management by telemedicine. The patient expressed understanding and agreed to proceed.

## 2020-10-21 ENCOUNTER — Other Ambulatory Visit
Admission: RE | Admit: 2020-10-21 | Discharge: 2020-10-21 | Disposition: A | Discharge status: Home / Self Care | Payer: No Typology Code available for payment source | Attending: Family Medicine | Admitting: Family Medicine

## 2020-10-21 ENCOUNTER — Other Ambulatory Visit: Payer: Self-pay

## 2020-10-21 DIAGNOSIS — E039 Hypothyroidism, unspecified: Secondary | ICD-10-CM | POA: Insufficient documentation

## 2020-10-21 DIAGNOSIS — R7401 Elevation of levels of liver transaminase levels: Secondary | ICD-10-CM

## 2020-10-21 DIAGNOSIS — R600 Localized edema: Secondary | ICD-10-CM | POA: Insufficient documentation

## 2020-10-21 DIAGNOSIS — R7989 Other specified abnormal findings of blood chemistry: Secondary | ICD-10-CM

## 2020-10-21 DIAGNOSIS — R945 Abnormal results of liver function studies: Secondary | ICD-10-CM

## 2020-10-21 LAB — HEPATIC FUNCTION PANEL
ALT: 85 U/L — ABNORMAL HIGH (ref 0–44)
AST: 51 U/L — ABNORMAL HIGH (ref 15–41)
Albumin: 3.5 g/dL (ref 3.5–5.0)
Alkaline Phosphatase: 58 U/L (ref 38–126)
Bilirubin, Direct: <0.1 mg/dL (ref 0.0–0.2)
Total Bilirubin: 0.5 mg/dL (ref 0.3–1.2)
Total Protein: 6.5 g/dL (ref 6.5–8.1)

## 2020-10-21 LAB — CBC WITH DIFFERENTIAL/PLATELET
Abs Immature Granulocytes: 0.03 10*3/uL (ref 0.00–0.07)
Basophils Absolute: 0 10*3/uL (ref 0.0–0.1)
Basophils Relative: 0 %
Eosinophils Absolute: 0.1 10*3/uL (ref 0.0–0.5)
Eosinophils Relative: 1 %
HCT: 37.1 % — ABNORMAL LOW (ref 39.0–52.0)
Hemoglobin: 11.8 g/dL — ABNORMAL LOW (ref 13.0–17.0)
Immature Granulocytes: 0 %
Lymphocytes Relative: 29 %
Lymphs Abs: 2.4 10*3/uL (ref 0.7–4.0)
MCH: 28 pg (ref 26.0–34.0)
MCHC: 31.8 g/dL (ref 30.0–36.0)
MCV: 88.1 fL (ref 80.0–100.0)
Monocytes Absolute: 0.7 10*3/uL (ref 0.1–1.0)
Monocytes Relative: 8 %
Neutro Abs: 4.9 10*3/uL (ref 1.7–7.7)
Neutrophils Relative %: 62 %
Platelets: 214 10*3/uL (ref 150–400)
RBC: 4.21 MIL/uL — ABNORMAL LOW (ref 4.22–5.81)
RDW: 14.6 % (ref 11.5–15.5)
WBC: 8.1 10*3/uL (ref 4.0–10.5)
nRBC: 0 % (ref 0.0–0.2)

## 2020-10-21 LAB — BASIC METABOLIC PANEL
Anion gap: 7 (ref 5–15)
BUN: 24 mg/dL — ABNORMAL HIGH (ref 6–20)
CO2: 30 mmol/L (ref 22–32)
Calcium: 8.7 mg/dL — ABNORMAL LOW (ref 8.9–10.3)
Chloride: 102 mmol/L (ref 98–111)
Creatinine, Ser: 0.76 mg/dL (ref 0.61–1.24)
GFR, Estimated: >60 mL/min (ref >60)
Glucose, Bld: 109 mg/dL — ABNORMAL HIGH (ref 70–99)
Potassium: 3.8 mmol/L (ref 3.5–5.1)
Sodium: 139 mmol/L (ref 135–145)

## 2020-10-21 LAB — TSH: TSH: 1.965 u[IU]/mL (ref 0.350–4.500)

## 2020-10-22 ENCOUNTER — Encounter: Payer: Self-pay | Admitting: Family Medicine

## 2020-11-04 ENCOUNTER — Other Ambulatory Visit
Admission: RE | Admit: 2020-11-04 | Discharge: 2020-11-04 | Disposition: A | Discharge status: Home / Self Care | Payer: No Typology Code available for payment source | Attending: Family Medicine | Admitting: Family Medicine

## 2020-11-04 ENCOUNTER — Other Ambulatory Visit: Payer: Self-pay

## 2020-11-04 DIAGNOSIS — R945 Abnormal results of liver function studies: Secondary | ICD-10-CM | POA: Insufficient documentation

## 2020-11-04 DIAGNOSIS — R7989 Other specified abnormal findings of blood chemistry: Secondary | ICD-10-CM

## 2020-11-04 DIAGNOSIS — R7401 Elevation of levels of liver transaminase levels: Secondary | ICD-10-CM

## 2020-11-04 LAB — HEPATIC FUNCTION PANEL
ALT: 55 U/L — ABNORMAL HIGH (ref 0–44)
AST: 35 U/L (ref 15–41)
Albumin: 3.6 g/dL (ref 3.5–5.0)
Alkaline Phosphatase: 51 U/L (ref 38–126)
Bilirubin, Direct: <0.1 mg/dL (ref 0.0–0.2)
Total Bilirubin: 0.6 mg/dL (ref 0.3–1.2)
Total Protein: 6.3 g/dL — ABNORMAL LOW (ref 6.5–8.1)

## 2020-12-11 ENCOUNTER — Encounter: Payer: No Typology Code available for payment source | Admitting: Family Medicine

## 2020-12-11 NOTE — Progress Notes (Signed)
This encounter was created in error - please disregard.

## 2021-01-02 IMAGING — MR MR KNEE*R* W/O CM
6 series · 40 of 40 positions shown · non-contrast
Comparison: Right knee and lower leg x-rays dated July 25, 2020.
Right lower leg ultrasound dated July 05, 2020.

CLINICAL DATA: Right lateral calf mass for the past 3 months. No
known injury.

EXAM:
MRI OF THE RIGHT KNEE WITHOUT CONTRAST
TECHNIQUE: Multiplanar, multisequence MR imaging of the knee was performed. No
intravenous contrast was administered.

[Series 5: T1 · coronal · right · 3.0mm · 0.75mm/px · 7 of 44 slices shown]
[im 1/44]
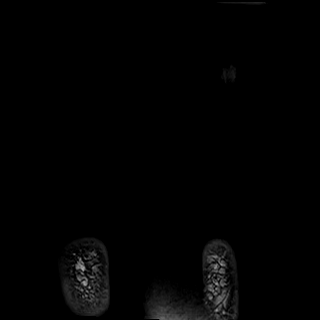
[im 8/44]
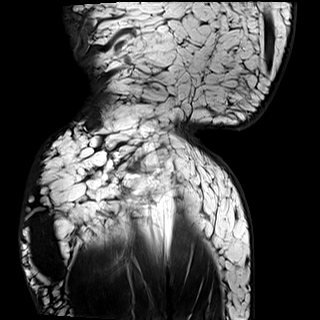
[im 15/44]
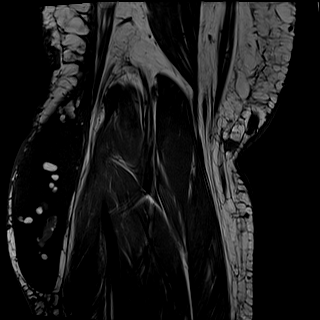
[im 22/44]
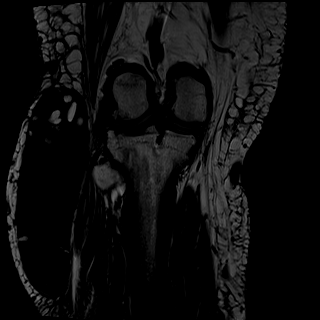
[im 29/44]
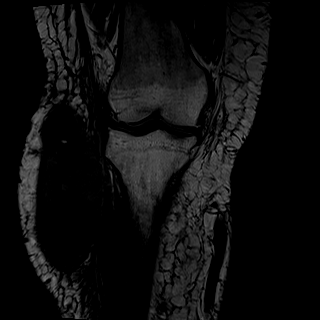
[im 36/44]
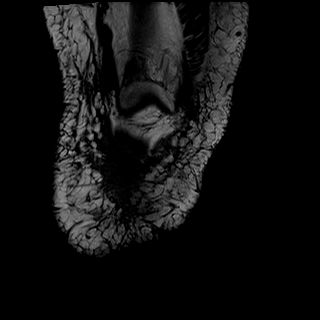
[im 44/44]
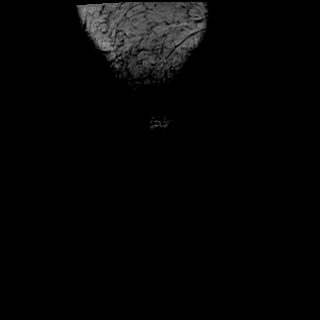

[Series 6: T2 fat-sat · coronal · right · 3.0mm · 0.94mm/px · 6 of 43 slices shown (1 of 3)]
[im 1/43]
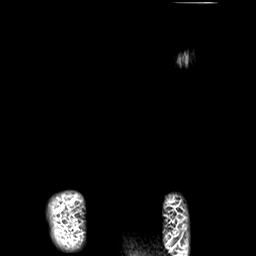
[im 9/43]
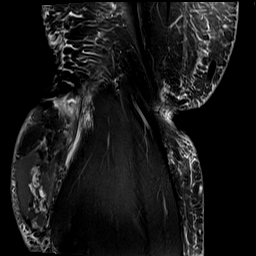
[im 17/43]
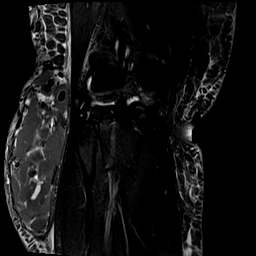
[im 26/43]
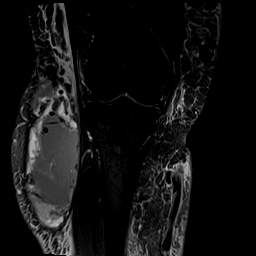
[im 34/43]
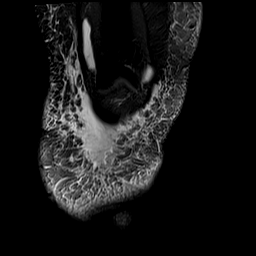
[im 43/43]
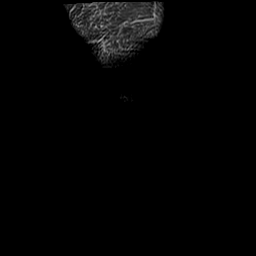

[Series 7: PD fat-sat · coronal · right · 3.0mm · 0.94mm/px · 6 of 44 slices shown (1 of 2)]
[im 1/44]
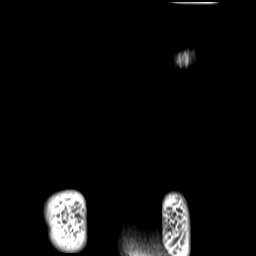
[im 9/44]
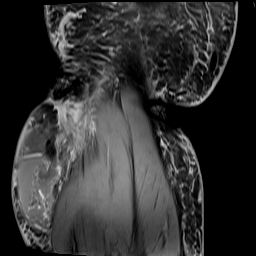
[im 18/44]
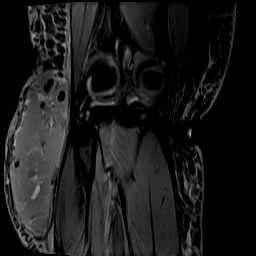
[im 26/44]
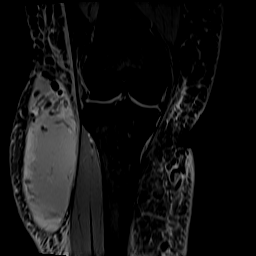
[im 35/44]
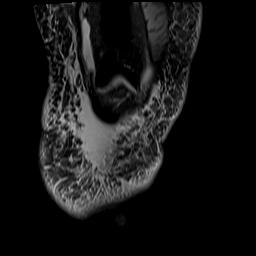
[im 44/44]
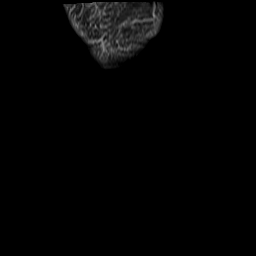

[Series 9: PD fat-sat · sagittal · right · 3.2mm · 0.94mm/px · 7 of 46 slices shown (2 of 2)]
[im 1/46]
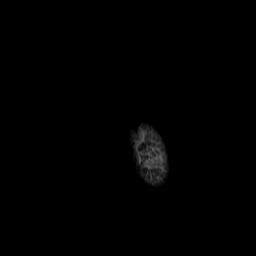
[im 8/46]
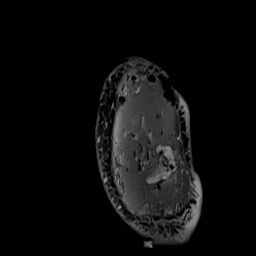
[im 16/46]
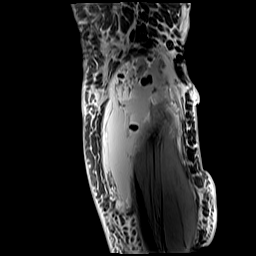
[im 23/46]
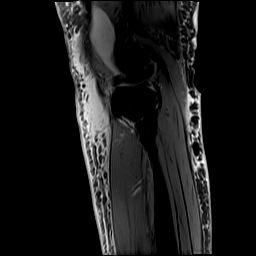
[im 31/46]
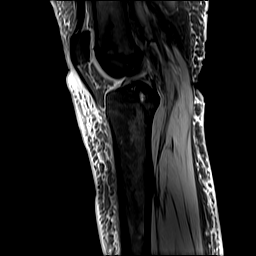
[im 38/46]
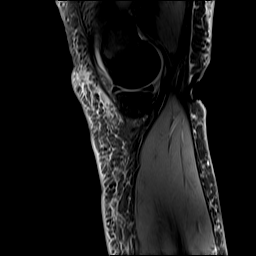
[im 46/46]
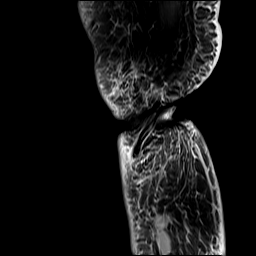

[Series 10: T2 fat-sat · sagittal · right · 3.2mm · 0.94mm/px · 7 of 46 slices shown (2 of 3)]
[im 1/46]
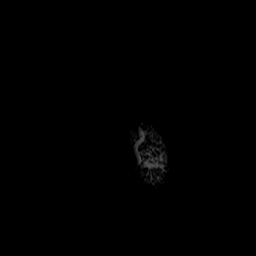
[im 8/46]
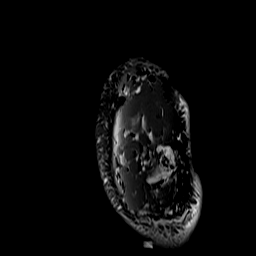
[im 16/46]
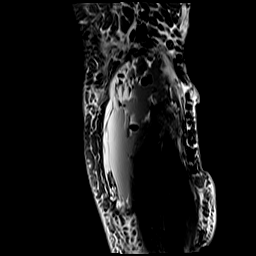
[im 23/46]
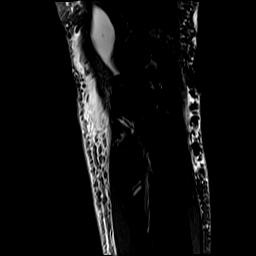
[im 31/46]
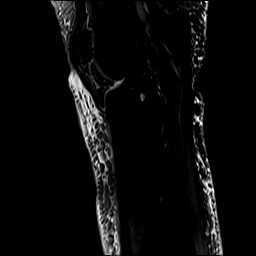
[im 38/46]
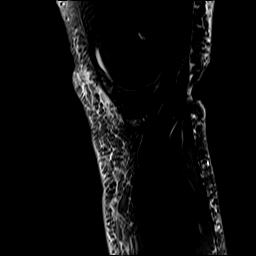
[im 46/46]
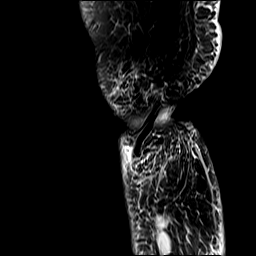

[Series 11: T2 fat-sat · axial · right · 4.0mm · 0.69mm/px · z∈[-79,+136]mm · 7 of 50 slices shown (3 of 3)]
[im 1/50]
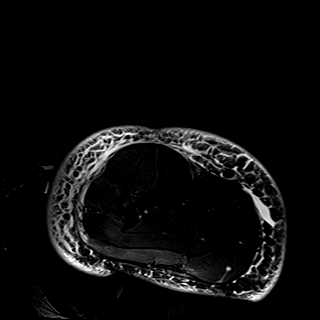
[im 9/50]
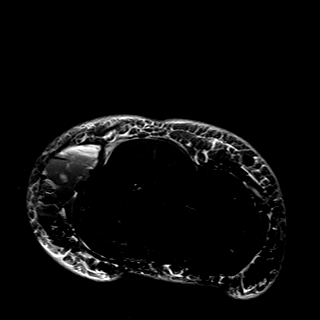
[im 17/50]
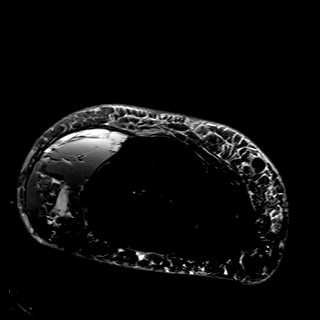
[im 25/50]
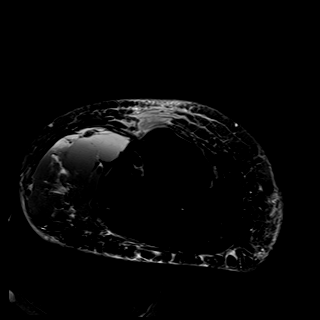
[im 33/50]
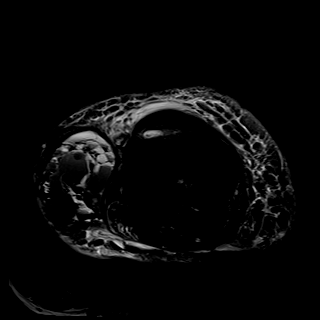
[im 41/50]
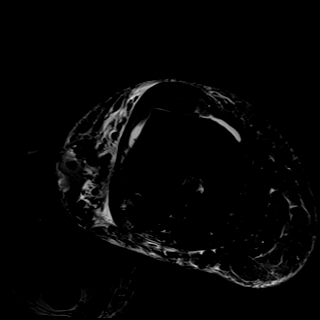
[im 50/50]
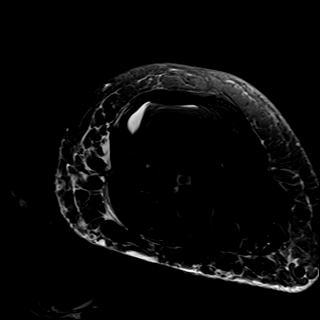

[40 of 40 positions shown; findings below may reference images not displayed]

FINDINGS: Evaluation of the knee structures is mildly limited due to larger
field of view utilized to include the entire lateral lower leg mass.

MENISCI

Medial meniscus:  Small radial tear of the body.

Lateral meniscus:  Small radial tear of the body.

LIGAMENTS

Cruciates:  Intact ACL and PCL.

Collaterals: Medial collateral ligament is intact. Lateral
collateral ligament complex is intact.

CARTILAGE

Patellofemoral: Mild partial-thickness cartilage loss over the
patellar apex and lateral facet.

Medial:  Mild partial-thickness cartilage loss.

Lateral:  Mild partial-thickness cartilage loss.

Joint:  Small joint effusion.  Normal Hoffa's fat.

Popliteal Fossa:  No Baker cyst. Intact popliteus tendon.

Extensor Mechanism: Intact quadriceps tendon and patellar tendon.
Intact medial and lateral patellar retinaculum. Intact MPFL.

Bones: No acute fracture or dislocation. No suspicious bone lesion.
Small tricompartmental marginal osteophytes.

Other: Large 8.1 x 4.7 x 15.6 cm (AP by transverse by CC)
heterogeneous fluid collection overlying the superficial fascia of
the right knee and proximal lower leg. The fluid collection contains
fluid-fluid levels and multiple internal foci of fat. Diffuse soft
tissue swelling about the knee and proximal lower leg.
IMPRESSION: 1. Large 8.1 x 4.7 x 15.6 cm heterogeneous fluid collection
overlying the superficial fascia of the right knee and proximal
lower leg. Appearance is most consistent with Sigqibo Arend
lesion.
2. Small radial tears of the medial and lateral meniscus bodies.
3. Mild tricompartmental osteoarthritis.

## 2021-01-10 ENCOUNTER — Other Ambulatory Visit (HOSPITAL_COMMUNITY): Payer: Self-pay

## 2021-01-10 MED FILL — Levothyroxine Sodium Tab 75 MCG: ORAL | 30 days supply | Qty: 30 | Fill #0 | Status: CN

## 2021-03-26 ENCOUNTER — Encounter: Payer: Self-pay | Admitting: *Deleted

## 2021-04-22 ENCOUNTER — Other Ambulatory Visit: Payer: Self-pay | Admitting: Family Medicine

## 2021-08-28 ENCOUNTER — Other Ambulatory Visit: Payer: Self-pay

## 2021-08-28 ENCOUNTER — Encounter: Payer: Self-pay | Admitting: Family Medicine

## 2021-09-01 ENCOUNTER — Other Ambulatory Visit: Payer: Self-pay

## 2021-09-01 MED ORDER — LEVOTHYROXINE SODIUM 75 MCG PO TABS: ORAL_TABLET | ORAL | 0 refills | Status: DC

## 2022-03-04 DIAGNOSIS — Z789 Other specified health status: Secondary | ICD-10-CM | POA: Insufficient documentation

## 2022-03-11 LAB — EXTERNAL GENERIC LAB PROCEDURE: FETAL FRACTION: 12.5

## 2022-05-27 DIAGNOSIS — G4733 Obstructive sleep apnea (adult) (pediatric): Secondary | ICD-10-CM | POA: Insufficient documentation

## 2022-11-30 DIAGNOSIS — R7989 Other specified abnormal findings of blood chemistry: Secondary | ICD-10-CM | POA: Insufficient documentation

## 2022-11-30 DIAGNOSIS — F419 Anxiety disorder, unspecified: Secondary | ICD-10-CM | POA: Insufficient documentation

## 2022-11-30 DIAGNOSIS — R600 Localized edema: Secondary | ICD-10-CM | POA: Insufficient documentation

## 2022-11-30 DIAGNOSIS — D509 Iron deficiency anemia, unspecified: Secondary | ICD-10-CM | POA: Insufficient documentation

## 2023-08-29 ENCOUNTER — Other Ambulatory Visit: Payer: Self-pay

## 2023-08-29 ENCOUNTER — Emergency Department (HOSPITAL_COMMUNITY)
Admission: EM | Admit: 2023-08-29 | Discharge: 2023-08-30 | Disposition: A | Discharge status: Other Acute Inpatient Hospital | Payer: PRIVATE HEALTH INSURANCE | Attending: Emergency Medicine | Admitting: Emergency Medicine

## 2023-08-29 ENCOUNTER — Encounter (HOSPITAL_COMMUNITY): Payer: Self-pay | Admitting: *Deleted

## 2023-08-29 DIAGNOSIS — R457 State of emotional shock and stress, unspecified: Secondary | ICD-10-CM | POA: Diagnosis not present

## 2023-08-29 DIAGNOSIS — F332 Major depressive disorder, recurrent severe without psychotic features: Secondary | ICD-10-CM | POA: Diagnosis present

## 2023-08-29 DIAGNOSIS — E039 Hypothyroidism, unspecified: Secondary | ICD-10-CM | POA: Diagnosis not present

## 2023-08-29 DIAGNOSIS — R45851 Suicidal ideations: Secondary | ICD-10-CM | POA: Diagnosis not present

## 2023-08-29 LAB — RAPID URINE DRUG SCREEN, HOSP PERFORMED
Amphetamines: NOT DETECTED
Barbiturates: NOT DETECTED
Benzodiazepines: NOT DETECTED
Cocaine: NOT DETECTED
Opiates: NOT DETECTED
Tetrahydrocannabinol: NOT DETECTED

## 2023-08-29 LAB — CBC
HCT: 36.8 % — ABNORMAL LOW (ref 39.0–52.0)
Hemoglobin: 12.2 g/dL — ABNORMAL LOW (ref 13.0–17.0)
MCH: 28.5 pg (ref 26.0–34.0)
MCHC: 33.2 g/dL (ref 30.0–36.0)
MCV: 86 fL (ref 80.0–100.0)
Platelets: 190 10*3/uL (ref 150–400)
RBC: 4.28 MIL/uL (ref 4.22–5.81)
RDW: 12.6 % (ref 11.5–15.5)
WBC: 7.9 10*3/uL (ref 4.0–10.5)
nRBC: 0 % (ref 0.0–0.2)

## 2023-08-29 LAB — COMPREHENSIVE METABOLIC PANEL
ALT: 16 U/L (ref 0–44)
AST: 21 U/L (ref 15–41)
Albumin: 3.5 g/dL (ref 3.5–5.0)
Alkaline Phosphatase: 47 U/L (ref 38–126)
Anion gap: 10 (ref 5–15)
BUN: 9 mg/dL (ref 6–20)
CO2: 24 mmol/L (ref 22–32)
Calcium: 8.8 mg/dL — ABNORMAL LOW (ref 8.9–10.3)
Chloride: 104 mmol/L (ref 98–111)
Creatinine, Ser: 0.68 mg/dL (ref 0.61–1.24)
GFR, Estimated: >60 mL/min (ref >60)
Glucose, Bld: 97 mg/dL (ref 70–99)
Potassium: 3.4 mmol/L — ABNORMAL LOW (ref 3.5–5.1)
Sodium: 138 mmol/L (ref 135–145)
Total Bilirubin: 0.6 mg/dL (ref <1.2)
Total Protein: 6.4 g/dL — ABNORMAL LOW (ref 6.5–8.1)

## 2023-08-29 LAB — HCG, SERUM, QUALITATIVE: Preg, Serum: NEGATIVE

## 2023-08-29 LAB — ACETAMINOPHEN LEVEL: Acetaminophen (Tylenol), Serum: <10 ug/mL — ABNORMAL LOW (ref 10–30)

## 2023-08-29 LAB — SALICYLATE LEVEL: Salicylate Lvl: <7 mg/dL — ABNORMAL LOW (ref 7.0–30.0)

## 2023-08-29 LAB — ETHANOL: Alcohol, Ethyl (B): <10 mg/dL (ref <10)

## 2023-08-29 MED ORDER — LEVOTHYROXINE SODIUM 75 MCG PO TABS
75.0000 ug | ORAL_TABLET | Freq: Every day | ORAL | Status: DC
  Administered 2023-08-29: 75 ug via ORAL
  Filled 2023-08-29: qty 1

## 2023-08-29 MED ORDER — ACETAMINOPHEN 500 MG PO TABS
1000.0000 mg | ORAL_TABLET | Freq: Four times a day (QID) | ORAL | Status: DC | PRN
  Administered 2023-08-29: 1000 mg via ORAL
  Filled 2023-08-29: qty 2

## 2023-08-29 MED ORDER — ACETAMINOPHEN 325 MG PO TABS
650.0000 mg | ORAL_TABLET | Freq: Once | ORAL | Status: AC
  Administered 2023-08-29: 650 mg via ORAL
  Filled 2023-08-29: qty 2

## 2023-08-29 MED ORDER — LEVOTHYROXINE SODIUM 75 MCG PO TABS: 75.0000 ug | ORAL_TABLET | Freq: Every day | ORAL | Status: DC

## 2023-08-29 MED ORDER — POTASSIUM CHLORIDE CRYS ER 20 MEQ PO TBCR
20.0000 meq | EXTENDED_RELEASE_TABLET | Freq: Once | ORAL | Status: AC
  Administered 2023-08-29: 20 meq via ORAL
  Filled 2023-08-29: qty 1

## 2023-08-29 NOTE — ED Notes (Signed)
IVC paperwork complete and in blue zone, expires 09/05/23, case # [id number]

## 2023-08-29 NOTE — ED Notes (Signed)
As per MD's note, patient is voluntary until she is not. Then they are IVC'd for safety observation. Charge Nurse made aware of this and patients wish to leave. ER Dan, DO made aware and placed IVC orders. Patient would like to speak with wife and Charge nurse at this time to verify and understand new process. He is not happy. No one told him any updates since they got here.

## 2023-08-29 NOTE — ED Notes (Signed)
Wife is on the phone again, was told to call back she states she was promised an hour of time. I told her they got 10 minutes.

## 2023-08-29 NOTE — ED Notes (Signed)
Wife updated patient per patient request.

## 2023-08-29 NOTE — BH Assessment (Signed)
@  02:58, this consult has been deferred to [facility]. This Clinician have provided the referral information to the [facility] coordinator, Christiane Ha. The [facility] provider will notify patients Emergency Department Care team in this once they are ready to initiate the assessment.

## 2023-08-29 NOTE — ED Provider Notes (Signed)
I assumed care of the patient at shift change.  I was notified that the patient had gotten a bed available for mental health hospitalization.  At that time she was that she is no longer suicidal and wanted to leave.  Based on the prior notes I do not feel like that is safe for them and I filled out involuntary commitment paperwork.   Melene Plan, DO 08/29/23 2053

## 2023-08-29 NOTE — Consult Note (Signed)
[facility] Tele[facility]iatry Consult Note  Patient Name: Theodore Scott MRN: [id number] DOB: 10-07-1980 DATE OF Consult: 08/29/2023  PRIMARY PSYCHIATRIC DIAGNOSES  1.  Suicidal Ideation 2.  Major Depressives Disorder, recurrent, severe   RECOMMENDATIONS  Based on my current evaluation and assessment of the patient, 42 year old Male (transgender to male), presents with history anxiety, sleep apnea, presenting to the ED for psychiatric evaluation.  Patient reportedly increased irritability, low frustration tolerance, feels impulsive; with complaints of increased life stressors  and marital problems leading to vegetative symptoms of depression and suicidal thoughts; patient is fearful of impulsivity and acting on thoughts of suicide, has contemplated many different plans.   No evident panic,no evident mania/hypomania; no psychosis, no noted PTSD symptoms during this interview. 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  Has good insight into how current symptoms and behaviors are affecting marriage and family, is voluntarily seeking help for depression/suicide ideations. Admit to inpt psych unit for mood stabilization and medication evaluation; if not voluntary recommend IVC.  Global Suicide Risk Assessment: The client is found to be at high risk of suicide or violence; isk lethality increased under context of drugs/alcohol. Encourage to abstain;  Staffed Dr. Hassie Bruce and RN Ranell Patrick  Inpt psych admission recommended:    [x] YES       [] NO   If yes:       [x]   Pt meets involuntary commitment criteria if not voluntary       []    Pt does not meet involuntary commitment criteria and must be         voluntary. If patient is not voluntary, then discharge is recommended.      Follow-Up Telepsychiatry C/L services:            []  We will continue to follow this  patient with you.             [x]  Will sign off for now. Please re-consult our service as necessary.     Total time spent in this encounter was 60 minutes with greater than 50% of time spent in counseling and coordination of care.  Thank you for involving [place] in the care of this patient. If you have any additional questions or concerns, please call [phone] and ask for me or the provider on-call.  TELEPSYCHIATRY ATTESTATION & CONSENT  As the provider for this telehealth consult, I attest that I verified the patient's identity using two separate identifiers, introduced myself to the patient, provided my credentials, disclosed my location, and performed this encounter via a HIPAA-compliant, real-time, face-to-face, two-way, interactive audio and video platform and with the full consent and agreement of the patient (or guardian as applicable.)  Patient physical location: [facility] ED Telehealth provider physical location: home office in state of [place]  Video start time: 06: 17am(Central Time) Video end time: 06:48am (Central Time)  IDENTIFYING DATA  Theodore Scott is a 42 y.o. year-old adult for whom a psychiatric consultation has been ordered by the primary provider. The patient was identified using two separate identifiers.  CHIEF COMPLAINT/REASON FOR CONSULT  "I'm transgender came out 4 yrs ago, married and have 2 kids,   HISTORY OF PRESENT ILLNESS (HPI)  The patient  42 year old Male (transgender to male), with history anxiety, sleep apnea, presenting to the ED for psychiatric evaluation.  Patient reportedly been very depressed recently, worsening irritability, feels  impulsive; reports ongoing suicidal thoughts with multiple plans "I fear if I leave here I will do it" thoughts of wanting to hit head on brick wall; drink antifreeze/motor oil, jump off a bridge, walk into traffic; "I'm not afraid to die, I am sick of this life".    Patient reports having work issues, marital problems.   Patient states "it is all me really, I know iit is, I don't see reality as reality so I make it up", reports pretends wife is abusive and controlling "and that really is not true, I am controlling and see everything in black and white".  Stated "I make a lot of drama, I lie a lot",  admits to manipulation of others; wife is stating she doesn't want to be married any longer;  They are here from [facility]  visiting for Thanksgiving, got into argument with wife; patient walked away in dark and came to hospital.   Patient reports testing completed with Dept of Rehab services due to work issues revealed diagnosis of Narcissistic Personality with Features borderline personality disorder autism, ADHD--patient denied taking any medication or current psychotherapy.   Today, patient cries through interview; reports anger, irritability, impulsive behaviors; symptoms of depression with anergia, anhedonia, amotivation, no anxiety or worry, feeling restlessness, no reported panic symptoms, no reported obsessive/compulsive behaviors. There is no evidence of psychosis or delusional thinking.  Client denied past episodes of hypomania, erratic/excessive spending, involvement in dangerous activities,  grandiosity, or promiscuity.  sleeping 3-6 hrs/24hrs, appetite fluctuates, concentration decreased. Denied self-mutilation behaviors. Reviewed active outpatient medication list/reviewed labs.   PAST PSYCHIATRIC HISTORY  Entered mental health system in high school when parents got divorced.  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    ibuprofen (ADVIL) 200 MG tablet Take 400-600 mg by mouth every 8 (eight) hours as needed (for pain.).   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   Social Drivers of Health Y/N   Physicist, medical Strain: N  Food Insecurity: N  Transportation Needs: N  Physical Activity: N  Stress: Y  Social Connections: N  Intimate Partner Violence: N  Housing Stability: N          Have you used/abused any of the following (include frequency/amt/last use):  a. Tobacco products N  b. ETOH Y  last drink wine in Sept c. Cannabis N d. Cocaine N  e. Prescription Stimulants N f. Methamphetamine N  g. Inhalants N  h. Sedative/sleeping pills N  i. Hallucinogens N  j. Street Opioids N   k. Prescription opioids N  l. Other: specify (spice, K2, bath salts, etc.)  N   Any history of substance related:  Blackouts:   - Tremors: - DUI: denied  D/T's: - seizures: -  UDS negative and BAL  <10 Pregnancy test:  negative  FAMILY HISTORY  Family History  Problem Relation Age of Onset   Hypertension Mother    Atrial fibrillation Mother    Depression Mother    Anxiety disorder Mother    Thyroid disease Mother    Sleep apnea Mother    Hyperlipidemia Father    Hypertension Father    Depression Father    Anxiety disorder Father    Diabetes Father    Sleep apnea Father    Bipolar disorder Sister    Anxiety disorder Sister    Thyroid disease Maternal Aunt    Diabetes Paternal Aunt    Diabetes Paternal Uncle    Heart attack Paternal Uncle    Lupus Maternal Grandmother    Thyroid disease Maternal Grandmother    Cancer Paternal Grandmother        breast   Diabetes Paternal Grandmother    Diabetes Paternal Grandfather    Family Psychiatric History (if known):  father depression, ADHD; mother anxiety, depression "I believe both of them have personality disorders"; sister bipolar, anxiety; both sisters engage in self cutting; maternal grandmother alcoholism "food addiction"; sister did some drugs in high school  MENTAL STATUS EXAM (MSE)  Appearance:  [] Appropriately dressed for the context and circumstances      []  Well-groomed       []  Casually dressed  [x] Hospital gown       []  Unkempt   Attitude:  [] Cooperative      []  Guarded       []  Aggressive      []  Demanding       []  Suspicious       []  Withdrawn  [x] Attention-seeking      []  Sullen   Behavior:   [] Normal motor activity and eye contact   []  Psychomotor slowing       [] Poor eye contact         [x] Tearful []  Restless       []  Fidgeting        [] Psychomotor agitation   Impulse Control  [] Good     [x]  Fair     []  Poor   Speech: [] Within normal limits for the context and circumstances  []  Slow       []  Soft       [] Monotone      [] Little spontaneous speech    []  Incoherent     []  Slurred    [x] Excessive       [x]  Rapid        []       []  Pressured       []  Dramatic      []  Accent noted   Mood:  [] Euthymic       []  Dysthymic     [x]  Depressed        []   Anxious        [x] Angry/Irritable   []  Expansive     []  Euphoric/Elated       Affect:  [] Congruent and appropriate to content of speech and circumstances  [] Full range    []  Constricted    []  Flat    []  Blunted    [x]  Exaggerated     [] Labile     [] Inappropriate:   Thought process:  [x] Linear, logical and goal directed    []  Disorganized    []  Circumstantial    [] Circumferential    []  Flight of ideas  [] Tangential   []  Thought blocking   []  Loose associations   Thought content:  [x] Denies hallucinations, delusions, or paranoia    []  Hallucinations [] Delusions      []  Paranoia        []  Ideas of reference      []  Obsessions        [] ELOC   Suicidality and Homicidality:  [] Denies any active or passive suicidal or homicidal ideation   []  Passive suicidal ideation [x] Active suicidal ideation     []  Passive homicidal ideation      []  Active homicidal ideation      []  As per HPI   Insight:   [x]  Good      []  Fair      []  Poor   Judgement:    []  Good      [x]  Fair      []  Poor   Memory:    [x]  Good       []  Fair      []  Poor   Attention/Concentration:    []  Good      [x]  Fair      []  Poor   Orientation:      [x]  Oriented to person, place, and time        []  Disoriented   Language:   [x] Normal Fluency      []  Fair Fluency      []  Poor Fluency   Fund of knowledge:    [x] Good       [] Fair         []  Poor  VITALS  Blood pressure 134/81, pulse 89, temperature 98.2 F (36.8 C), resp. rate 18, height 5\' 9"  (1.753 m), weight 104.5 kg, last menstrual period 08/22/2023, SpO2 99%.  LABS  Admission on 08/29/2023  Component Date Value Ref Range Status   Sodium 08/29/2023 138  135 - 145 mmol/L Final   Potassium 08/29/2023 3.4 (L)  3.5 - 5.1 mmol/L Final   Chloride 08/29/2023 104  98 - 111 mmol/L Final   CO2 08/29/2023 24  22 - 32 mmol/L Final   Glucose, Bld 08/29/2023 97  70 - 99 mg/dL Final   Glucose reference range applies only to samples taken after  fasting for at least 8 hours.   BUN 08/29/2023 9  6 - 20 mg/dL Final   Creatinine, Ser 08/29/2023 0.68  0.61 - 1.24 mg/dL Final   Calcium 78/29/5621 8.8 (L)  8.9 - 10.3 mg/dL Final   Total Protein 30/86/5784 6.4 (L)  6.5 - 8.1 g/dL Final   Albumin 69/62/9528 3.5  3.5 - 5.0 g/dL Final   AST 41/32/4401 21  15 - 41 U/L Final   ALT 08/29/2023 16  0 - 44 U/L Final   Alkaline Phosphatase 08/29/2023 47  38 - 126 U/L Final   Total Bilirubin 08/29/2023 0.6  <1.2 mg/dL Final   GFR, Estimated 08/29/2023 >60  >  60 mL/min Final   Comment: (NOTE) Calculated using the CKD-EPI Creatinine Equation (2021)    Anion gap 08/29/2023 10  5 - 15 Final   Performed at [facility], [address], [place], [place] [address]   Alcohol, Ethyl (B) 08/29/2023 <10  <10 mg/dL Final   Comment: (NOTE) Lowest detectable limit for serum alcohol is 10 mg/dL.  For medical purposes only. Performed at [facility], [address] [place], [place] [address]    Salicylate Lvl 08/29/2023 <7.0 (L)  7.0 - 30.0 mg/dL Final   Performed at [facility], [address], [place], [place] [address]   Acetaminophen (Tylenol), Serum 08/29/2023 <10 (L)  10 - 30 ug/mL Final   Comment: (NOTE) Therapeutic concentrations vary significantly. A range of 10-30 ug/mL  may be an effective concentration for many patients. However, some  are best treated at concentrations outside of this range. Acetaminophen concentrations >150 ug/mL at 4 hours after ingestion  and >50 ug/mL at 12 hours after ingestion are often associated with  toxic reactions.  Performed at [facility], [address] [place], [place] [address]    WBC 08/29/2023 7.9  4.0 - 10.5 K/uL Final   RBC 08/29/2023 4.28  4.22 - 5.81 MIL/uL Final   Hemoglobin 08/29/2023 12.2 (L)  13.0 - 17.0 g/dL Final   HCT 36/64/4034 36.8 (L)  39.0 - 52.0 % Final   MCV 08/29/2023 86.0  80.0 - 100.0 fL Final   MCH 08/29/2023 28.5  26.0 - 34.0 pg Final   MCHC 08/29/2023  33.2  30.0 - 36.0 g/dL Final   RDW 74/25/9563 12.6  11.5 - 15.5 % Final   Platelets 08/29/2023 190  150 - 400 K/uL Final   nRBC 08/29/2023 0.0  0.0 - 0.2 % Final   Performed at [facility] Lab, [address], [place], [place] [address]   Opiates 08/29/2023 NONE DETECTED  NONE DETECTED Final   Cocaine 08/29/2023 NONE DETECTED  NONE DETECTED Final   Benzodiazepines 08/29/2023 NONE DETECTED  NONE DETECTED Final   Amphetamines 08/29/2023 NONE DETECTED  NONE DETECTED Final   Tetrahydrocannabinol 08/29/2023 NONE DETECTED  NONE DETECTED Final   Barbiturates 08/29/2023 NONE DETECTED  NONE DETECTED Final   Comment: (NOTE) DRUG SCREEN FOR MEDICAL PURPOSES ONLY.  IF CONFIRMATION IS NEEDED FOR ANY PURPOSE, NOTIFY LAB WITHIN 5 DAYS.  LOWEST DETECTABLE LIMITS FOR URINE DRUG SCREEN Drug Class                     Cutoff (ng/mL) Amphetamine and metabolites    1000 Barbiturate and metabolites    200 Benzodiazepine                 200 Opiates and metabolites        300 Cocaine and metabolites        300 THC                            50 Performed at [facility], [address] [place], [place] [address]    Preg, Serum 08/29/2023 NEGATIVE  NEGATIVE Final   Comment:        THE SENSITIVITY OF THIS METHODOLOGY IS >10 mIU/mL. Performed at [facility], [address] [place], [place] [address]     PSYCHIATRIC REVIEW OF SYSTEMS (ROS)    Additional findings:      Musculoskeletal:  No abnormal movements observed      Gait & Station: Laying/Sitting      Pain Screening: Denies      Nutrition & Dental Concerns: none reported  RISK FORMULATION/ASSESSMENT  Is the patient experiencing any suicidal or homicidal ideations: Yes       Explain if yes:  Protective factors considered for safety management:  Absence of psychosis Access to adequate health care Advice& help seeking Resourcefulness/Survival skills Children Sense of responsibility    Risk factors/concerns considered for  safety management:  Depression Access to lethal means Hopelessness Impulsivity  Is there a safety management plan with the patient and treatment team to minimize risk factors and promote protective factors: Yes           Explain: safety observation Is crisis care placement or psychiatric hospitalization recommended: Yes     Based on my current evaluation and risk assessment, patient is determined at this time to be at:  High risk  Global Suicide Risk Assessment: The client is found to be at high risk of suicide or violence; however, risk lethality increased under context of drugs/alcohol. Encouraged to abstain  *RISK ASSESSMENT Risk assessment is a dynamic process; it is possible that this patient's condition, and risk level, may change. This should be re-evaluated and managed over time as appropriate. Please re-consult psychiatric consult services if additional assistance is needed in terms of risk assessment and management. If your team decides to discharge this patient, please advise the patient how to best access emergency psychiatric services, or to call 911, if their condition worsens or they feel unsafe in any way.    Dr. Olivia Mackie. Christell Constant, PhD, MSN, APRN, PMHNP-BC, [facility] Helper, NP Telepsychiatry Consult Services

## 2023-08-29 NOTE — ED Notes (Signed)
Patient now states they are not suicidal anymore after talking with their wife. Patient would like to go home now.

## 2023-08-29 NOTE — ED Notes (Signed)
Pt's belongings have been itemized and placed in small purple locker #3. Pt is in triage 2B. BH packet is with Triage RN.

## 2023-08-29 NOTE — ED Triage Notes (Signed)
The pt is feeling depressed she has had thoughts of killing herself  all day and different ways of doing it    she is visiting here from [place]  and was very upset coming here tonight  lmp last week

## 2023-08-29 NOTE — Progress Notes (Signed)
[facility] LCSW Progress Note   08/29/2023    4:15 PM  EMERY DEKAM   [id number]   Type of Contact and Topic:  Psychiatric Bed Placement   Pt accepted to [facility] 401-2    Patient meets inpatient criteria per Liborio Nixon, NP   The attending provider will be Dr. Abbott Pao   Call report to [phone]    Aletta Edouard, RN @ [facility] ED notified.     Pt scheduled  to arrive at [facility] TONIGHT @ 2230.    Damita Dunnings, MSW, LCSW-A  4:16 PM 08/29/2023

## 2023-08-29 NOTE — ED Provider Notes (Signed)
Emergency Medicine Observation Re-evaluation Note  Theodore Scott is a 42 y.o. adult, seen on rounds today.  Pt initially presented to the ED for complaints of Psychiatric Evaluation Currently, the patient is stable.  Physical Exam  BP 135/76   Pulse 73   Temp (!) 97.5 F (36.4 C) (Oral)   Resp 16   Ht 1.753 m (5\' 9" )   Wt 104.5 kg   LMP 08/22/2023   SpO2 100%   BMI 34.02 kg/m  Physical Exam General: wdwn Cardiac: rrr Lungs: no distress Psych: si, crying  ED Course / MDM  EKG:   I have reviewed the labs performed to date as well as medications administered while in observation.  Recent changes in the last 24 hours include telepsych evaluation.  Plan  Current plan is for inpatient psych.  Currently voluntary but will commit if not.    Margarita Grizzle, MD 08/29/23 [phone]

## 2023-08-29 NOTE — ED Provider Notes (Signed)
[place] EMERGENCY DEPARTMENT AT [facility] Provider Note   CSN: [id number] Arrival date & time: 08/29/23  0016     History  Chief Complaint  Patient presents with   Psychiatric Evaluation    Theodore Scott is a 42 y.o. adult.  The history is provided by the patient and medical records.   42 year old F (transgender to male), with history of GERD, hypothyroidism, anxiety, PCOS, sleep apnea, presenting to the ED for psychiatric evaluation.  Patient reportedly been very depressed recently, having a lot of emotional issues.  Has a lot of deep feelings but does not really want to process or go through them, would rather just be "numb".  Has had trouble processing "conversations in their head".   Apparently there has been some strain with wife and potential separation but this has not occurred yet.  Wife is apparently been trying to get them some help but not been able to talk to anyone recently.  Has had some suicidal thoughts of potentially walking into traffic or jumping off a bridge but no concrete plan or attempted self-harm.  Denies any illicit drug use.  No heavy alcohol use.  Home Medications Prior to Admission medications   Medication Sig Start Date End Date Taking? Authorizing Provider  acetaminophen (TYLENOL) 500 MG tablet Take 1,000 mg by mouth every 6 (six) hours as needed for mild pain or headache.     [provider]  CALCIUM PO Take 1 tablet by mouth at bedtime.  Patient not taking: Reported on 10/14/2020    [provider]  cholecalciferol (VITAMIN D3) 25 MCG (1000 UT) tablet Take 1,000 Units by mouth at bedtime.  Patient not taking: Reported on 10/14/2020    [provider]  Ferrous Sulfate (IRON PO) Take 1 tablet by mouth at bedtime.  Patient not taking: Reported on 10/14/2020    [provider]  furosemide (LASIX) 20 MG tablet TAKE 1 TABLET (20 MG TOTAL) BY MOUTH DAILY. 10/14/20 10/14/21  Sheliah Hatch, MD  ibuprofen  (ADVIL) 200 MG tablet Take 400-600 mg by mouth every 8 (eight) hours as needed (for pain.).    [provider]  levothyroxine (SYNTHROID) 75 MCG tablet Take 1 Tablet (75 mcg total) by mouth Once a day 09/01/21   Sheliah Hatch, MD  Prenatal Vit-Fe Fumarate-FA (MULTIVITAMIN-PRENATAL) 27-0.8 MG TABS tablet Take 1 tablet by mouth at bedtime.  Patient not taking: Reported on 10/14/2020    [provider]      Allergies    Vicodin [hydrocodone-acetaminophen]    Review of Systems   Review of Systems  Psychiatric/Behavioral:  Positive for suicidal ideas.   All other systems reviewed and are negative.   Physical Exam Updated Vital Signs BP 134/81   Pulse 89   Temp 98.2 F (36.8 C)   Resp 18   Ht 5\' 9"  (1.753 m)   Wt 104.5 kg   LMP 08/22/2023   SpO2 99%   BMI 34.02 kg/m   Physical Exam Vitals and nursing note reviewed.  Constitutional:      Appearance: He is well-developed.  HENT:     Head: Normocephalic and atraumatic.  Eyes:     Conjunctiva/sclera: Conjunctivae normal.     Pupils: Pupils are equal, round, and reactive to light.  Cardiovascular:     Rate and Rhythm: Normal rate and regular rhythm.     Heart sounds: Normal heart sounds.  Pulmonary:     Effort: Pulmonary effort is normal.  Breath sounds: Normal breath sounds.  Abdominal:     General: Bowel sounds are normal.     Palpations: Abdomen is soft.  Musculoskeletal:        General: Normal range of motion.     Cervical back: Normal range of motion.  Skin:    General: Skin is warm and dry.  Neurological:     Mental Status: He is alert and oriented to person, place, and time.  Psychiatric:        Thought Content: Thought content includes suicidal ideation. Thought content does not include homicidal ideation. Thought content includes suicidal plan. Thought content does not include homicidal plan.     ED Results / Procedures / Treatments   Labs (all labs ordered are listed, but only  abnormal results are displayed) Labs Reviewed  COMPREHENSIVE METABOLIC PANEL - Abnormal; Notable for the following components:      Result Value   Potassium 3.4 (*)    Calcium 8.8 (*)    Total Protein 6.4 (*)    All other components within normal limits  SALICYLATE LEVEL - Abnormal; Notable for the following components:   Salicylate Lvl <7.0 (*)    All other components within normal limits  ACETAMINOPHEN LEVEL - Abnormal; Notable for the following components:   Acetaminophen (Tylenol), Serum <10 (*)    All other components within normal limits  CBC - Abnormal; Notable for the following components:   Hemoglobin 12.2 (*)    HCT 36.8 (*)    All other components within normal limits  ETHANOL  RAPID URINE DRUG SCREEN, HOSP PERFORMED  HCG, SERUM, QUALITATIVE    EKG None  Radiology No results found.  Procedures Procedures    Medications Ordered in ED Medications - No data to display  ED Course/ Medical Decision Making/ A&P                                 Medical Decision Making  42 year old male (transgender male) presenting to the ED for psychiatric evaluation.  Has had a lot of high emotions recently, possible separation with wife, etc. patient has had thoughts of potentially walking into traffic or jumping off a bridge but no concrete plan and has not acted on this.  Labs obtained today--no leukocytosis or electrolyte derangement.  Negative Tylenol, salicylate, and ethanol levels.  UDS also negative.  Medically cleared.  Will get TTS evaluation.  6:01 AM TTS deferred to [facility], should be done sometime this AM.  Day team to follow-up on recommendations.  Medical reconciliation not done by pharmacy overnight, home meds can be ordered once completed.  Final Clinical Impression(s) / ED Diagnoses Final diagnoses:  Suicidal ideation  Emotional stress    Rx / DC Orders ED Discharge Orders     None         Garlon Hatchet, PA-C 08/29/23 1610     Shon Baton, MD 08/29/23 [phone]

## 2023-08-30 ENCOUNTER — Other Ambulatory Visit: Payer: Self-pay

## 2023-08-30 ENCOUNTER — Inpatient Hospital Stay (HOSPITAL_COMMUNITY)
Admission: AD | Admit: 2023-08-30 | Discharge: 2023-09-01 | DRG: 881 | Disposition: A | Discharge status: Home / Self Care | Payer: PRIVATE HEALTH INSURANCE | Source: Intra-hospital | Attending: Psychiatry | Admitting: Psychiatry

## 2023-08-30 ENCOUNTER — Encounter (HOSPITAL_COMMUNITY): Payer: Self-pay

## 2023-08-30 ENCOUNTER — Encounter (HOSPITAL_COMMUNITY): Payer: Self-pay | Admitting: Psychiatry

## 2023-08-30 DIAGNOSIS — Z833 Family history of diabetes mellitus: Secondary | ICD-10-CM

## 2023-08-30 DIAGNOSIS — F411 Generalized anxiety disorder: Secondary | ICD-10-CM | POA: Diagnosis present

## 2023-08-30 DIAGNOSIS — F329 Major depressive disorder, single episode, unspecified: Principal | ICD-10-CM | POA: Diagnosis present

## 2023-08-30 DIAGNOSIS — Z79899 Other long term (current) drug therapy: Secondary | ICD-10-CM

## 2023-08-30 DIAGNOSIS — F330 Major depressive disorder, recurrent, mild: Secondary | ICD-10-CM | POA: Insufficient documentation

## 2023-08-30 DIAGNOSIS — F332 Major depressive disorder, recurrent severe without psychotic features: Secondary | ICD-10-CM | POA: Diagnosis not present

## 2023-08-30 DIAGNOSIS — Z8249 Family history of ischemic heart disease and other diseases of the circulatory system: Secondary | ICD-10-CM

## 2023-08-30 DIAGNOSIS — R45851 Suicidal ideations: Secondary | ICD-10-CM | POA: Diagnosis present

## 2023-08-30 DIAGNOSIS — Z8349 Family history of other endocrine, nutritional and metabolic diseases: Secondary | ICD-10-CM | POA: Diagnosis not present

## 2023-08-30 DIAGNOSIS — Z630 Problems in relationship with spouse or partner: Secondary | ICD-10-CM

## 2023-08-30 DIAGNOSIS — R002 Palpitations: Secondary | ICD-10-CM | POA: Insufficient documentation

## 2023-08-30 DIAGNOSIS — N912 Amenorrhea, unspecified: Secondary | ICD-10-CM | POA: Insufficient documentation

## 2023-08-30 DIAGNOSIS — Z832 Family history of diseases of the blood and blood-forming organs and certain disorders involving the immune mechanism: Secondary | ICD-10-CM

## 2023-08-30 DIAGNOSIS — Z803 Family history of malignant neoplasm of breast: Secondary | ICD-10-CM | POA: Diagnosis not present

## 2023-08-30 DIAGNOSIS — F840 Autistic disorder: Secondary | ICD-10-CM | POA: Diagnosis present

## 2023-08-30 DIAGNOSIS — Z8489 Family history of other specified conditions: Secondary | ICD-10-CM | POA: Diagnosis not present

## 2023-08-30 DIAGNOSIS — E039 Hypothyroidism, unspecified: Secondary | ICD-10-CM | POA: Diagnosis present

## 2023-08-30 DIAGNOSIS — L209 Atopic dermatitis, unspecified: Secondary | ICD-10-CM | POA: Insufficient documentation

## 2023-08-30 DIAGNOSIS — Z818 Family history of other mental and behavioral disorders: Secondary | ICD-10-CM

## 2023-08-30 DIAGNOSIS — Z83438 Family history of other disorder of lipoprotein metabolism and other lipidemia: Secondary | ICD-10-CM

## 2023-08-30 DIAGNOSIS — F6081 Narcissistic personality disorder: Secondary | ICD-10-CM | POA: Diagnosis present

## 2023-08-30 DIAGNOSIS — F640 Transsexualism: Secondary | ICD-10-CM | POA: Diagnosis present

## 2023-08-30 DIAGNOSIS — R5383 Other fatigue: Secondary | ICD-10-CM | POA: Insufficient documentation

## 2023-08-30 DIAGNOSIS — J309 Allergic rhinitis, unspecified: Secondary | ICD-10-CM | POA: Insufficient documentation

## 2023-08-30 DIAGNOSIS — Z79890 Hormone replacement therapy (postmenopausal): Secondary | ICD-10-CM

## 2023-08-30 DIAGNOSIS — F909 Attention-deficit hyperactivity disorder, unspecified type: Secondary | ICD-10-CM | POA: Diagnosis present

## 2023-08-30 MED ORDER — BUPROPION HCL ER (XL) 150 MG PO TB24
150.0000 mg | ORAL_TABLET | Freq: Every day | ORAL | Status: DC
  Administered 2023-08-31 – 2023-09-01 (×2): 150 mg via ORAL
  Filled 2023-08-30 (×5): qty 1

## 2023-08-30 MED ORDER — HYDROXYZINE HCL 25 MG PO TABS
25.0000 mg | ORAL_TABLET | Freq: Three times a day (TID) | ORAL | Status: DC | PRN
  Filled 2023-08-30: qty 1

## 2023-08-30 MED ORDER — HALOPERIDOL LACTATE 5 MG/ML IJ SOLN: 10.0000 mg | Freq: Three times a day (TID) | INTRAMUSCULAR | Status: DC | PRN

## 2023-08-30 MED ORDER — TRAZODONE HCL 50 MG PO TABS
50.0000 mg | ORAL_TABLET | Freq: Every evening | ORAL | Status: DC | PRN
  Filled 2023-08-30: qty 1

## 2023-08-30 MED ORDER — DIPHENHYDRAMINE HCL 50 MG/ML IJ SOLN: 50.0000 mg | Freq: Three times a day (TID) | INTRAMUSCULAR | Status: DC | PRN

## 2023-08-30 MED ORDER — HALOPERIDOL LACTATE 5 MG/ML IJ SOLN: 5.0000 mg | Freq: Three times a day (TID) | INTRAMUSCULAR | Status: DC | PRN

## 2023-08-30 MED ORDER — LORAZEPAM 2 MG/ML IJ SOLN: 2.0000 mg | Freq: Three times a day (TID) | INTRAMUSCULAR | Status: DC | PRN

## 2023-08-30 MED ORDER — MAGNESIUM HYDROXIDE 400 MG/5ML PO SUSP: 30.0000 mL | Freq: Every day | ORAL | Status: DC | PRN

## 2023-08-30 MED ORDER — LEVOTHYROXINE SODIUM 75 MCG PO TABS
75.0000 ug | ORAL_TABLET | Freq: Every day | ORAL | Status: DC
  Administered 2023-08-31 – 2023-09-01 (×2): 75 ug via ORAL
  Filled 2023-08-30 (×2): qty 1
  Filled 2023-08-30 (×2): qty 7
  Filled 2023-08-30 (×2): qty 1

## 2023-08-30 MED ORDER — HALOPERIDOL 5 MG PO TABS: 5.0000 mg | ORAL_TABLET | Freq: Three times a day (TID) | ORAL | Status: DC | PRN

## 2023-08-30 MED ORDER — DIPHENHYDRAMINE HCL 25 MG PO CAPS: 50.0000 mg | ORAL_CAPSULE | Freq: Three times a day (TID) | ORAL | Status: DC | PRN

## 2023-08-30 MED ORDER — ALUM & MAG HYDROXIDE-SIMETH 200-200-20 MG/5ML PO SUSP: 30.0000 mL | ORAL | Status: DC | PRN

## 2023-08-30 NOTE — Group Note (Signed)
Date:  08/30/2023 Time:  9:30 PM  Group Topic/Focus:  Alcoholics Anonymous Meeting    Participation Level:  Active  Participation Quality:  Appropriate  Affect:  Appropriate  Cognitive:  Appropriate  Insight: Appropriate  Engagement in Group:  Engaged  Modes of Intervention:  Discussion and Support  Additional Comments:  Patient attend AA  Theodore Scott 08/30/2023, 9:30 PM

## 2023-08-30 NOTE — [facility] Suicide Risk Assessment (Signed)
[facility] INPATIENT:  Family/Significant Other Suicide Prevention Education  Suicide Prevention Education:  Education Completed; Theodore Scott, wife [phone]  (name of family member/significant other) has been identified by the patient as the family member/significant other with whom the patient will be residing, and identified as the person(s) who will aid the patient in the event of a mental health crisis (suicidal ideations/suicide attempt).  With written consent from the patient, the family member/significant other has been provided the following suicide prevention education, prior to the and/or following the discharge of the patient.  CSW received consent to speak to pt's wife to gather collateral information wife reported no safety concerns with pt returning home. Cecily reported no firearms in the home, she will lock away all knives, scissors, medications and all sharps. Cecily reported she would like to have family counseling. Cecily reported pt is being evaluated for Autism Spectrum Disorder but has a diagnosis of ADHD. Cecily reported pt has a lot of trauma related to her parent's divorce. Cecily inquired discharge about discharge date, CSW shared that pt continues to be evaluated and we will know more on a daily basis.   The suicide prevention education provided includes the following: Suicide risk factors Suicide prevention and interventions National Suicide Hotline telephone number [facility] assessment telephone number [facility] Emergency Assistance 911 [facility] and/or Residential Mobile Crisis Unit telephone number  Request made of family/significant other to: Remove weapons (e.g., guns, rifles, knives), all items previously/currently identified as safety concern.   Remove drugs/medications (over-the-counter, prescriptions, illicit drugs), all items previously/currently identified as a safety concern.  The family member/significant other verbalizes  understanding of the suicide prevention education information provided.  The family member/significant other agrees to remove the items of safety concern listed above.  Rogene Houston 08/30/2023, 3:36 PM
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
## 2023-08-30 NOTE — Plan of Care (Signed)
  Problem: Activity: Goal: Interest or engagement in activities will improve Outcome: Progressing   Problem: Coping: Goal: Ability to demonstrate self-control will improve Outcome: Progressing   Problem: Safety: Goal: Periods of time without injury will increase Outcome: Progressing   

## 2023-08-30 NOTE — Progress Notes (Addendum)
Patient ID: Theodore Scott, adult   DOB: 08-Apr-1981, 42 y.o.   MRN: [id number]    IVC paperwork placed in shadow chart.

## 2023-08-30 NOTE — BH IP Treatment Plan (Signed)
Interdisciplinary Treatment and Diagnostic Plan Update  08/30/2023 Time of Session: 11:45AM Theodore Scott MRN: [id number]  Principal Diagnosis: MDD (major depressive disorder)  Secondary Diagnoses: Principal Problem:   MDD (major depressive disorder)   Current Medications:  Current Facility-Administered Medications  Medication Dose Route Frequency Provider Last Rate Last Admin   alum & mag hydroxide-simeth (MAALOX/MYLANTA) 200-200-20 MG/5ML suspension 30 mL  30 mL Oral Q4H PRN White, Patrice L, NP       haloperidol (HALDOL) tablet 5 mg  5 mg Oral TID PRN White, Patrice L, NP       And   diphenhydrAMINE (BENADRYL) capsule 50 mg  50 mg Oral TID PRN White, Patrice L, NP       haloperidol lactate (HALDOL) injection 5 mg  5 mg Intramuscular TID PRN White, Patrice L, NP       And   diphenhydrAMINE (BENADRYL) injection 50 mg  50 mg Intramuscular TID PRN White, Patrice L, NP       And   LORazepam (ATIVAN) injection 2 mg  2 mg Intramuscular TID PRN White, Patrice L, NP       haloperidol lactate (HALDOL) injection 10 mg  10 mg Intramuscular TID PRN White, Patrice L, NP       And   diphenhydrAMINE (BENADRYL) injection 50 mg  50 mg Intramuscular TID PRN White, Patrice L, NP       And   LORazepam (ATIVAN) injection 2 mg  2 mg Intramuscular TID PRN White, Patrice L, NP       hydrOXYzine (ATARAX) tablet 25 mg  25 mg Oral TID PRN White, Patrice L, NP       [START ON 08/31/2023] levothyroxine (SYNTHROID) tablet 75 mcg  75 mcg Oral Q0600 Rex Kras, MD       magnesium hydroxide (MILK OF MAGNESIA) suspension 30 mL  30 mL Oral Daily PRN White, Patrice L, NP       traZODone (DESYREL) tablet 50 mg  50 mg Oral QHS PRN White, Patrice L, NP       PTA Medications: Medications Prior to Admission  Medication Sig Dispense Refill Last Dose   acetaminophen (TYLENOL) 500 MG tablet Take 1,000 mg by mouth every 6 (six) hours as needed for mild pain or headache.       ibuprofen (ADVIL) 200 MG tablet Take  400-600 mg by mouth every 8 (eight) hours as needed (for pain.).      levothyroxine (SYNTHROID) 75 MCG tablet Take 1 Tablet (75 mcg total) by mouth Once a day 90 tablet 0     Patient Stressors: Educational concerns   Other: feeling overwhelmed    Patient Strengths: Ability for insight  Average or above average intelligence  Financial means  General fund of knowledge  Motivation for treatment/growth  Supportive family/friends   Treatment Modalities: Medication Management, Group therapy, Case management,  1 to 1 session with clinician, Psychoeducation, Recreational therapy.   Physician Treatment Plan for Primary Diagnosis: MDD (major depressive disorder) Long Term Goal(s):     Short Term Goals:    Medication Management: Evaluate patient's response, side effects, and tolerance of medication regimen.  Therapeutic Interventions: 1 to 1 sessions, Unit Group sessions and Medication administration.  Evaluation of Outcomes: Not Progressing  Physician Treatment Plan for Secondary Diagnosis: Principal Problem:   MDD (major depressive disorder)  Long Term Goal(s):     Short Term Goals:       Medication Management: Evaluate patient's response, side effects, and tolerance of medication  regimen.  Therapeutic Interventions: 1 to 1 sessions, Unit Group sessions and Medication administration.  Evaluation of Outcomes: Not Progressing   RN Treatment Plan for Primary Diagnosis: MDD (major depressive disorder) Long Term Goal(s): Knowledge of disease and therapeutic regimen to maintain health will improve  Short Term Goals: Ability to remain free from injury will improve, Ability to verbalize frustration and anger appropriately will improve, Ability to demonstrate self-control, Ability to participate in decision making will improve, Ability to verbalize feelings will improve, Ability to disclose and discuss suicidal ideas, Ability to identify and develop effective coping behaviors will  improve, and Compliance with prescribed medications will improve  Medication Management: RN will administer medications as ordered by provider, will assess and evaluate patient's response and provide education to patient for prescribed medication. RN will report any adverse and/or side effects to prescribing provider.  Therapeutic Interventions: 1 on 1 counseling sessions, Psychoeducation, Medication administration, Evaluate responses to treatment, Monitor vital signs and CBGs as ordered, Perform/monitor CIWA, COWS, AIMS and Fall Risk screenings as ordered, Perform wound care treatments as ordered.  Evaluation of Outcomes: Not Progressing   LCSW Treatment Plan for Primary Diagnosis: MDD (major depressive disorder) Long Term Goal(s): Safe transition to appropriate next level of care at discharge, Engage patient in therapeutic group addressing interpersonal concerns.  Short Term Goals: Engage patient in aftercare planning with referrals and resources, Increase social support, Increase ability to appropriately verbalize feelings, Increase emotional regulation, Facilitate acceptance of mental health diagnosis and concerns, Facilitate patient progression through stages of change regarding substance use diagnoses and concerns, Identify triggers associated with mental health/substance abuse issues, and Increase skills for wellness and recovery  Therapeutic Interventions: Assess for all discharge needs, 1 to 1 time with Social worker, Explore available resources and support systems, Assess for adequacy in community support network, Educate family and significant other(s) on suicide prevention, Complete Psychosocial Assessment, Interpersonal group therapy.  Evaluation of Outcomes: Not Progressing   Progress in Treatment: Attending groups: Yes. Participating in groups: Yes. Taking medication as prescribed: Yes. Toleration medication: Yes. Family/Significant other contact made: No, will contact:   consents pending Patient understands diagnosis: Yes. Discussing patient identified problems/goals with staff: Yes. Medical problems stabilized or resolved: Yes. Denies suicidal/homicidal ideation: Yes. Issues/concerns per patient self-inventory: No.  New problem(s) identified: No, Describe:  none  New Short Term/Long Term Goal(s): medication stabilization, elimination of SI thoughts, development of comprehensive mental wellness plan.    Patient Goals:  "Work on Pharmacologist regarding cognitive distortions"   Discharge Plan or Barriers: Patient recently admitted. CSW will continue to follow and assess for appropriate referrals and possible discharge planning.    Reason for Continuation of Hospitalization: Anxiety Depression Medication stabilization Suicidal ideation  Estimated Length of Stay: 5-7 days  Last 3 [place] Suicide Severity Risk Score: Flowsheet Row [facility] (Current[facility] from 08/29/2023 in [facility]  C-SSRS RISK CATEGORY Moderate Risk High Risk       Last [facility] 2/9 Scores:    10/14/2020    8:12 AM 06/07/2020   10:38 AM 10/27/2019    1:06 PM  Depression screen PHQ 2/9  Decreased Interest 0 0 0  Down, Depressed, Hopeless 0 0 0  PHQ - 2 Score 0 0 0  Altered sleeping 0 0 0  Tired, decreased energy 0 0 0  Change in appetite 0 0 0  Feeling bad or failure about yourself  0 0 0  Trouble concentrating 0 0 0  Moving slowly or fidgety/restless 0 0 0  Suicidal thoughts 0 0 0  PHQ-9 Score 0 0 0  Difficult doing work/chores Not difficult at all Not difficult at all Not difficult at all    Scribe for Treatment Team: Kathi Der, LCSWA 08/30/2023 12:54 PM

## 2023-08-30 NOTE — Tx Team (Signed)
Initial Treatment Plan 08/30/2023 1:53 AM Theodore Scott ZOX:[id number]    PATIENT STRESSORS: Educational concerns   Other: feeling overwhelmed     PATIENT STRENGTHS: Ability for insight  Average or above average intelligence  Financial means  General fund of knowledge  Motivation for treatment/growth  Supportive family/friends    PATIENT IDENTIFIED PROBLEMS:   Depression  Feeling overwhelmed  Suicidal ideation  "Work on Pharmacologist"  "Open to medication"           DISCHARGE CRITERIA:  Improved stabilization in mood, thinking, and/or behavior Motivation to continue treatment in a less acute level of care Need for constant or close observation no longer present Verbal commitment to aftercare and medication compliance  PRELIMINARY DISCHARGE PLAN: Attend aftercare/continuing care group Outpatient therapy Return to previous living arrangement Return to previous work or school arrangements  PATIENT/FAMILY INVOLVEMENT: This treatment plan has been presented to and reviewed with the patient, Theodore Scott,  The patient and family have been given the opportunity to ask questions and make suggestions.  JEHU-APPIAH, Salley Scarlet, RN 08/30/2023, 1:53 AM

## 2023-08-30 NOTE — ED Notes (Signed)
[facility] called for patient transport

## 2023-08-30 NOTE — B[facility] Counselor (Signed)
Adult Comprehensive Assessment  Patient ID: Theodore Scott, adult   DOB: 1981/07/20, 42 y.o.   MRN: [id number]  Information Source: Information source: Patient (PSA completed with pt)  Current Stressors:  Patient states their primary concerns and needs for treatment are:: " I have been dealing intrusive thoughst and having difficulty what is my thoughts and not my thoughts" Patient states their goals for this hospitilization and ongoing recovery are:: " I would like to have more coping skills and medicaiton managment" Educational / Learning stressors: " No but  I do have ADHD" Employment / Job issues: " No" Family Relationships: " No" Financial / Lack of resources (include bankruptcy): " No" Housing / Lack of housing: " No" Physical health (include injuries & life threatening diseases): " No" Social relationships: "yes, I struggle with friendship" Substance abuse: " No" Bereavement / Loss: " No"  Living/Environment/Situation:  Living Arrangements: Spouse/significant other, Children Living conditions (as described by patient or guardian): " we have suitable housing" Who else lives in the home?: " my wife and 2 kids (39yrs and 1 yr), 2 dogs and 2cats How long has patient lived in current situation?: 2 yrs What is atmosphere in current home: Comfortable, Paramedic  Family History:  Marital status: Married Number of Years Married: 6 What types of issues is patient dealing with in the relationship?: " we have a great relationship" Additional relationship information: na Are you sexually active?: Yes What is your sexual orientation?: " I am straight" Has your sexual activity been affected by drugs, alcohol, medication, or emotional stress?: " I do not use substances" Does patient have children?: Yes How many children?: 2 How is patient's relationship with their children?: " we have a good relationship"  Childhood History:  By whom was/is the patient raised?: Both parents Additional  childhood history information: na Description of patient's relationship with caregiver when they were a child: " " it was a fair relationship, it was difficult" Patient's description of current relationship with people who raised him/her: " Our relationship is poor" How were you disciplined when you got in trouble as a child/adolescent?: " I did not get into too much trouble" Does patient have siblings?: Yes Number of Siblings: 2 Description of patient's current relationship with siblings: "... 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" Currently a student?: Yes Name of school: [employer] in [place] How long has the patient attended?: 1 yr Learning disability?: No  Employment/Work Situation:   Employment Situation: Employed Where is Patient Currently Employed?: 2 yrs How Long has Patient Been Employed?: 2 yrs Are You Satisfied With Your Job?: Yes Do You Work More Than One Job?: No Work Stressors: " none" Patient's Job has Been Impacted by Current Illness: No What is the Longest Time Patient has Held a Job?: 3.5 yrs Where was the Patient Employed at that Time?: [employer] for the [employer] Has Patient ever Been in the [employer]?: No  Financial Resources:   Financial resources: Income from employment Does patient have a representative payee or guardian?: No  Alcohol/Substance Abuse:   What has  been your use of drugs/alcohol within the last 12 months?: " I do not use" If attempted suicide, did drugs/alcohol play a role in this?:  No Alcohol/Substance Abuse Treatment Hx: Denies past history If yes, describe treatment: na Has alcohol/substance abuse ever caused legal problems?: No  Social Support System:   Patient's Community Support System: Good Describe Community Support System: " I will have a therapist soon" Type of faith/religion: " I am searching" How does patient's faith help to cope with current illness?: " I am not sure yet"  Leisure/Recreation:   Do You Have Hobbies?: Yes Leisure and Hobbies: ' I would working on my house, cleaning and organizing"  Strengths/Needs:   What is the patient's perception of their strengths?: " I am good communicator and I like being helpful" Patient states these barriers may affect/interfere with their treatment: "" no barriers" Patient states these barriers may affect their return to the community: " no barriers" Other important information patient would like considered in planning for their treatment: na  Discharge Plan:   Currently receiving community mental health services: No Patient states concerns and preferences for aftercare planning are: " I would like to have a therapist and a doctor to manage my medications" Patient states they will know when they are safe and ready for discharge when: " I think I am ready now" Does patient have access to transportation?: Yes (my wife will pick me up) Does patient have financial barriers related to discharge medications?: No (pt has active medical coverage) Patient description of barriers related to discharge medications: " no ma'am, no barriers" Will patient be returning to same living situation after discharge?: Yes  Summary/Recommendations:   Summary and Recommendations (to be completed by the evaluator): Theodore Scott is 42 yo transgendered male to male involuntarily admitted to [facility] after presenting to [facility] due to suicidal ideations with a plan to walk into traffic. Pt reported stressors as physical health, social relationships,  parents divorce and he is a Gaffer with assignments due. Pt denies SI/HI/AVH. Pt reported he has a diagnosis of ADHD and is being evaluated for Autism Spectrum Disorder. Patient will benefit from crisis stabilization, medication evaluation, group therapy and psychoeducation, in addition to case management for discharge planning. At discharge it is recommended that Patient adhere to the established discharge plan and continue in treatment.  Pt reported that he currently does not have outpatient services but is requesting therapist and medication management following discharge.  Peterson Mathey R. 08/30/2023

## 2023-08-30 NOTE — Group Note (Signed)
Date:  08/30/2023 Time:  12:00 PM  Group Topic/Focus:  Emotional Education:   The focus of this group is to discuss what feelings/emotions are, and how they are experienced.    Participation Level:  Active  Participation Quality:  Appropriate  Affect:  Appropriate  Cognitive:  Appropriate  Insight: Appropriate  Engagement in Group:  Engaged  Modes of Intervention:  Discussion and Education  Additional Comments:    Theodore Scott 08/30/2023, 12:00 PM

## 2023-08-30 NOTE — Group Note (Signed)
Recreation Therapy Group Note   Group Topic:Healthy Decision Making  Group Date: 08/30/2023 Start Time: 1013 End Time: 1033 Facilitators: Ever Halberg-McCall, LRT,CTRS Location: 400 Hall Dayroom   Group Topic: Decision Making, Problem Solving, Communication  Goal Area(s) Addresses:  Patient will effectively work with peer towards shared goal.  Patient will identify factors that guided their decision making.  Patient will pro-socially communicate ideas during group session.   Intervention: Survival Scenario - pencil, paper  Group Description: Patients were given a scenario that they were going to be stranded on a deserted [place] for several months before being rescued. Writer tasked them with making a list of 15 things they would choose to bring with them for "survival". The list of items was prioritized most important to least. Each patient would come up with their own list, then work together to create a new list of 15 items while in a group of 3-5 peers. LRT discussed each person's list and how it differed from others. The debrief included discussion of priorities, good decisions versus bad decisions, and how it is important to think before acting so we can make the best decision possible. LRT tied the concept of effective communication among group members to patient's support systems outside of the hospital and its benefit post discharge.  Education: Pharmacist, community, Priorities, Support System, Discharge Planning   Education Outcome: Acknowledges education/In group clarification/Needs additional education   Affect/Mood: Appropriate   Participation Level: Engaged   Participation Quality: Independent   Behavior: Appropriate   Speech/Thought Process: Focused   Insight: Good   Judgement: Good   Modes of Intervention: Activity   Patient Response to Interventions:  Engaged   Education Outcome:  In group clarification offered    Clinical Observations/Individualized  Feedback: Pt was quiet in the beginning but opened up more as group progressed. Pt was thoughtful in the items she identified. Pt selected items such as knife, rope/chord, flare gun, inflatable raft, satellite phone, jacket, etc. Pt was bright and engaging with peers in compiling their joint list. Pt was attentive and social during group.     Plan: Continue to engage patient in RT group sessions 2-3x/week.   Alylah Blakney-McCall, LRT,CTRS 08/30/2023 1:13 PM
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
## 2023-08-30 NOTE — Progress Notes (Signed)
Patient ID: Theodore Scott, adult   DOB: 04/10/1981, 42 y.o.   MRN: [id number] Admission note: Patient is an involuntary admission in no acute distress for depression and feeling overwhelmed. Pt reports he came from [place] to spent thanks giving with wife's family in [place]. Pt stated he felt overwhelmed with traveling, he is also a Gaffer with assignments due. Pt reports having intrusive thoughts without a plan. Pt reports not currently receiving outpatient services, no current medication. Pt is endorsing no past suicidal attempt by self harm. Pt admitted to unit per protocol, skin assessment and belonging search done. No skin issues noted. Consent signed by pt. Pt educated on therapeutic milieu rules. Pt was introduced to milieu by nursing staff. Fall risk / suicide safety plan explained to the patient. 15 minutes checks started for safety.

## 2023-08-30 NOTE — Group Note (Signed)
Date:  08/30/2023 Time:  9:25 AM  Group Topic/Focus:  Goals Group:   The focus of this group is to help patients establish daily goals to achieve during treatment and discuss how the patient can incorporate goal setting into their daily lives to aide in recovery. Orientation:   The focus of this group is to educate the patient on the purpose and policies of crisis stabilization and provide a format to answer questions about their admission.  The group details unit policies and expectations of patients while admitted.    Participation Level:  Active  Participation Quality:  Appropriate  Affect:  Appropriate  Cognitive:  Appropriate  Insight: Appropriate  Engagement in Group:  Engaged  Modes of Intervention:  Discussion  Additional Comments:    Asaiah Scarber D [place] Hibberd 08/30/2023, 9:25 AM

## 2023-08-30 NOTE — [facility] Group Notes (Signed)
Spiritual care group on grief and loss facilitated by Chaplain Dyanne Carrel, [facility]  Group Goal: Support / Education around grief and loss  Members engage in facilitated group support and psycho-social education.  Group Description:  Following introductions and group rules, group members engaged in facilitated group dialogue and support around topic of loss, with particular support around experiences of loss in their lives. Group Identified types of loss (relationships / self / things) and identified patterns, circumstances, and changes that precipitate losses. Reflected on thoughts / feelings around loss, normalized grief responses, and recognized variety in grief experience. Group encouraged individual reflection on safe space and on the coping skills that they are already utilizing.  Group drew on Adlerian / Rogerian and narrative framework  Patient Progress: Theodore Scott attended group.  Though verbal participation was minimal, he demonstrated engagement in the group conversation.

## 2023-08-30 NOTE — Progress Notes (Signed)
   08/30/23 0925  Psych Admission Type (Psych Patients Only)  Admission Status Involuntary  Psychosocial Assessment  Patient Complaints Anxiety  Eye Contact Fair  Facial Expression Anxious  Affect Anxious  Speech Logical/coherent  Interaction Assertive  Motor Activity Other (Comment) (WNL)  Appearance/Hygiene Unremarkable  Behavior Characteristics Cooperative  Mood Depressed  Thought Process  Coherency WDL  Content WDL  Delusions None reported or observed  Perception WDL  Hallucination None reported or observed  Judgment WDL  Confusion None  Danger to Self  Current suicidal ideation? Denies  Danger to Others  Danger to Others None reported or observed

## 2023-08-31 MED ORDER — WHITE PETROLATUM EX OINT
TOPICAL_OINTMENT | CUTANEOUS | Status: AC
  Administered 2023-08-31: 1
  Filled 2023-08-31: qty 5

## 2023-08-31 MED ORDER — BUSPIRONE HCL 5 MG PO TABS
5.0000 mg | ORAL_TABLET | Freq: Two times a day (BID) | ORAL | Status: DC
  Administered 2023-08-31 – 2023-09-01 (×2): 5 mg via ORAL
  Filled 2023-08-31 (×2): qty 1
  Filled 2023-08-31 (×4): qty 14
  Filled 2023-08-31: qty 1

## 2023-08-31 NOTE — Group Note (Signed)
LCSW Group Therapy Note  Group Date: 08/31/2023 Start Time: 1110 End Time: 1210   Type of Therapy and Topic:  Group Therapy - Healthy vs Unhealthy Coping Skills  Participation Level:  Active   Description of Group The focus of this group was to determine what unhealthy coping techniques typically are used by group members and what healthy coping techniques would be helpful in coping with various problems. Patients were guided in becoming aware of the differences between healthy and unhealthy coping techniques. Patients were asked to identify 2-3 healthy coping skills they would like to learn to use more effectively.  Therapeutic Goals Patients learned that coping is what human beings do all day long to deal with various situations in their lives Patients defined and discussed healthy vs unhealthy coping techniques Patients identified their preferred coping techniques and identified whether these were healthy or unhealthy Patients determined 2-3 healthy coping skills they would like to become more familiar with and use more often. Patients provided support and ideas to each other   Summary of Patient Progress:  During group, patient expressed being scared of change however reports he wants to challenge himself to be better at accepting transitions. Patients reports he knows he has a problem with pride, and reports he wants to learn how to be more humble in order to let his guard down. Patient proved open to input from peers and feedback from CSW. Patient demonstrated good insight into the subject matter, was respectful of peers, and participated throughout the entire session.   Therapeutic Modalities Cognitive Behavioral Therapy Motivational Interviewing  Loleta Dicker, LCSW 08/31/2023  2:12 PM

## 2023-08-31 NOTE — [facility] Group Notes (Signed)
[facility] Group Notes:  (Nursing/MHT/Case Management/Adjunct)  Date:  08/31/2023  Time:  10:13 PM  Type of Therapy:  Psychoeducational Skills  Participation Level:  Minimal  Participation Quality:  Attentive  Affect:  Appropriate  Cognitive:  Alert  Insight:  Limited  Engagement in Group:  Limited  Modes of Intervention:  Education  Summary of Progress/Problems: The patient rated her day as a 7 out of 10 and described her day as having been "hard". The patient did not go into further detail. She states that she a good visit this evening and that she is trying to deal with "reality".   Theodore Scott 08/31/2023, 10:13 PM

## 2023-08-31 NOTE — Plan of Care (Addendum)
Problem: Activity: Goal: Interest or engagement in activities will improve Outcome: Progressing   Problem: Health Behavior/Discharge Planning: Goal: Compliance with treatment plan for underlying cause of condition will improve Outcome: Progressing   Problem: Safety: Goal: Periods of time without injury will increase Outcome: Progressing   Problem: Safety: Goal: Periods of time without injury will increase Outcome: Progressing   Pt visible in hall, dayroom at long intervals this shift. Observed with flat affect, fair eye contact, logical speech but restless, anxious and tearful on initial interactions. Denies SI, HI, AVH and pain when assessed. However, remains preoccupied about events leading to admission "I keep ruminating about things, internalizing other people's feelings especially my wife and I get very overwhelmed. Presently I feel lot of guilt for leaving her to care for 2 dogs and 2 kids when we were suppose to be on holiday with family". Pt has been compliant with current treatment regimen, cooperative with unit routines. 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 Tolerates meals and fluids well.  Attended and participated in scheduled groups.  Pt's concerns validated. Emotional support, encouragement and reassurance offered. Safety checks maintained at Q 15 minutes intervals without issues.

## 2023-08-31 NOTE — Group Note (Signed)
Recreation Therapy Group Note   Group Topic:Animal Assisted Therapy   Group Date: 08/31/2023 Start Time: 8295 End Time: 1033 Facilitators: Erin Obando-McCall, LRT,CTRS Location: 300 Hall Dayroom   Animal-Assisted Activity (AAA) Program Checklist/Progress Notes Patient Eligibility Criteria Checklist & Daily Group note for Rec Tx Intervention  AAA/T Program Assumption of Risk Form signed by Patient/ or Parent Legal Guardian Yes  Patient is free of allergies or severe asthma Yes  Patient reports no fear of animals Yes  Patient reports no history of cruelty to animals Yes  Patient understands his/her participation is voluntary Yes  Patient washes hands before animal contact Yes  Patient washes hands after animal contact Yes  Education: Hand Washing, Appropriate Animal Interaction   Education Outcome: Acknowledges education.    Affect/Mood: Appropriate   Participation Level: Engaged   Participation Quality: Independent   Behavior: Appropriate   Speech/Thought Process: Focused   Insight: Good   Judgement: Good   Modes of Intervention: Teaching laboratory technician   Patient Response to Interventions:  Engaged   Education Outcome:  In group clarification offered    Clinical Observations/Individualized Feedback: Patient attended session and interacted appropriately with therapy dog and peers. Patient asked appropriate questions about therapy dog and his training. Patient shared stories about their pets at home with group.     Plan: Continue to engage patient in RT group sessions 2-3x/week.   Malaika Arnall-McCall, LRT,CTRS  08/31/2023 12:46 PM

## 2023-08-31 NOTE — Group Note (Signed)
Date:  08/31/2023 Time:  10:25 AM  Group Topic/Focus:  Orientation:   The focus of this group is to educate the patient on the purpose and policies of crisis stabilization and provide a format to answer questions about their admission.  The group details unit policies and expectations of patients while admitted.    Participation Level:  Active  Participation Quality:  Appropriate  Affect:  Appropriate  Cognitive:  Appropriate  Insight: Appropriate  Engagement in Group:  Engaged  Modes of Intervention:  Discussion  Additional Comments:     Reymundo Poll 08/31/2023, 10:25 AM

## 2023-08-31 NOTE — Progress Notes (Signed)
   08/30/23 2056  Psych Admission Type (Psych Patients Only)  Admission Status Involuntary  Psychosocial Assessment  Patient Complaints None  Eye Contact Fair  Facial Expression Anxious  Affect Anxious  Speech Logical/coherent  Interaction Assertive  Motor Activity Other (Comment) (WDL)  Appearance/Hygiene Unremarkable  Behavior Characteristics Cooperative  Mood Depressed  Thought Process  Coherency WDL  Content WDL  Delusions None reported or observed  Perception WDL  Hallucination None reported or observed  Judgment WDL  Confusion WDL  Danger to Self  Current suicidal ideation? Denies  Danger to Others  Danger to Others None reported or observed

## 2023-08-31 NOTE — Progress Notes (Signed)
   08/31/23 2200  Psych Admission Type (Psych Patients Only)  Admission Status Involuntary  Psychosocial Assessment  Patient Complaints Nervousness  Eye Contact Fair  Facial Expression Anxious  Affect Appropriate to circumstance  Speech Logical/coherent  Interaction Assertive  Motor Activity Slow  Appearance/Hygiene Unremarkable  Behavior Characteristics Cooperative  Mood Anxious;Pleasant  Thought Process  Content Blaming self  Delusions None reported or observed  Perception WDL  Hallucination None reported or observed  Judgment WDL  Confusion None  Danger to Self  Current suicidal ideation? Denies  Danger to Others  Danger to Others None reported or observed

## 2023-08-31 NOTE — Progress Notes (Signed)
[facility] MD Progress Note  08/31/2023 2:48 PM Theodore Scott  MRN:  [id number]  Principal Problem: MDD (major depressive disorder) Diagnosis: Principal Problem:   MDD (major depressive disorder)   Reason for Admission:   Theodore Scott is a 42 y.o., adult transgendered male to male with a past psychiatric history of Anxiety who presents to the [facility] Involuntary from [facility] Emergency Department for evaluation and symptom stabilization for suicidal ideations and rule out of major depressive disorder. (admitted on 08/30/2023, total  LOS: 1 day )  Chart Review from last 24 hours:  The patient's chart was reviewed and nursing notes were reviewed. The patient's case was discussed in multidisciplinary team meeting.   - Overnight events to report per chart review / staff report: no notable overnight events to report - Patient received all scheduled medications - Patient did not receive any PRN medications  Information Obtained Today During Patient Interview: The patient was seen and evaluated on the unit. On assessment today the patient reports reports that she continues to ruminate on how this hospitalization is effecting his wife and family. Feels disappointed that he's unable to help with home duties and caring for the children due to him being here. Realizes the importance of focusing on this hospitalization to get better. Visited with wife yesterday and conversation went well. Discussed ways to better communicate going forward and reflected. Amenable with trial of wellbutrin today.   He reports attending group sessions and finds them to be helpful, specifically he's able to learn about better coping strategies and deal with his ruminating thoughts . Patient describes the following coping skills they have developed while hospitalized: group sessions are allowing me to recognize the importance of communication and being vulnerable .  Patient endorses poor sleep in the context of  being in a new environment; endorses good appetite.  Patient does not endorse any side-effects they attribute to medications.  Past Psychiatric History:  Previous psych diagnoses:  Anxiety, ADHD (self reported per patient after testing)  Prior inpatient psychiatric treatment: Denies Prior outpatient psychiatric treatment: Denies Current psychiatric provider: Denies   Neuromodulation history: denies   Current therapist:  Denies, attempting to establish with providers back [place].  Psychotherapy hx: Denies   History of suicide attempts: Denies History of homicide: Denies   Psychotropic medications: Current Denies - Not prescribed any psychiatric medications at the moment   Past Escitalopram - patient reports intolerable side-effect, namely increased restlessness feeling.   Substance Use History: Alcohol: never drinks Hx withdrawal tremors/shakes: denies Hx alcohol related blackouts: denies Hx alcohol induced hallucinations: denies Hx alcoholic seizures: denies Hx medical hospitalization due to severe alcohol withdrawal symptoms: denies DUI: denies   --------   Tobacco: never smoked, vaped, or chewed tobacco Cannabis (marijuana): never tried Cocaine: never tried Methamphetamines: never tried Psilocybin (mushrooms): never tried Ecstasy (MDMA / molly): never tried LSD (acid): never tried Opiates (fentanyl / heroin): never tried Benzos (Xanax, Klonopin): never tried IV drug use: denies Prescribed meds abuse: denies   History of detox: denies History of rehab: denies    Past Medical History:  Past Medical History:  Diagnosis Date   Anxiety    Arthritis    Depression    Ectopic heartbeat    Hypothyroidism    PCOS (polycystic ovarian syndrome)    Sleep apnea     Family Psychiatric History:  Psych: MDD (Dad) and Mother (unspecified psychiatric illness), Sister (Bipolar Disorder)  Psych Rx: Unsure  Suicide: Denies  Homicide:  Denies  Substance use  family hx: EtOH MGM    Social History:   Place of birth and grew up where: [place] Abuse: no history of abuse Marital Status: Married Sexual orientation: gay Children: 2 children  Employment:Full time Gaffer and  employed at [employer] level of education: Graduated from [employer], pursuing Masters Degree in IT consultant currently  Housing:Lives in [place] with wife and 2 children Finances: employment income and full time Warehouse manager: no Special educational needs teacher: never served Consulting civil engineer: denies owning any firearms Pills stockpile: Denies   Current Medications: Current Facility-Administered Medications  Medication Dose Route Frequency Provider Last Rate Last Admin   alum & mag hydroxide-simeth (MAALOX/MYLANTA) 200-200-20 MG/5ML suspension 30 mL  30 mL Oral Q4H PRN White, Patrice L, NP       buPROPion (WELLBUTRIN XL) 24 hr tablet 150 mg  150 mg Oral Daily Peterson Ao, MD   150 mg at 08/31/23 0759   haloperidol (HALDOL) tablet 5 mg  5 mg Oral TID PRN White, Patrice L, NP       And   diphenhydrAMINE (BENADRYL) capsule 50 mg  50 mg Oral TID PRN White, Patrice L, NP       haloperidol lactate (HALDOL) injection 5 mg  5 mg Intramuscular TID PRN White, Patrice L, NP       And   diphenhydrAMINE (BENADRYL) injection 50 mg  50 mg Intramuscular TID PRN White, Patrice L, NP       And   LORazepam (ATIVAN) injection 2 mg  2 mg Intramuscular TID PRN White, Patrice L, NP       haloperidol lactate (HALDOL) injection 10 mg  10 mg Intramuscular TID PRN White, Patrice L, NP       And   diphenhydrAMINE (BENADRYL) injection 50 mg  50 mg Intramuscular TID PRN White, Patrice L, NP       And   LORazepam (ATIVAN) injection 2 mg  2 mg Intramuscular TID PRN White, Patrice L, NP       hydrOXYzine (ATARAX) tablet 25 mg  25 mg Oral TID PRN White, Patrice L, NP       levothyroxine (SYNTHROID) tablet 75 mcg  75 mcg Oral Q0600 Rex Kras, MD   75 mcg at 08/31/23 0620   magnesium  hydroxide (MILK OF MAGNESIA) suspension 30 mL  30 mL Oral Daily PRN White, Patrice L, NP       traZODone (DESYREL) tablet 50 mg  50 mg Oral QHS PRN White, Patrice L, NP        Lab Results: No results found for this or any previous visit (from the past 48 hour(s)).  Blood Alcohol level:  Lab Results  Component Value Date   ETH <10 08/29/2023    Metabolic Labs: Lab Results  Component Value Date   HGBA1C 5.1 01/31/2018   No results found for: "PROLACTIN" Lab Results  Component Value Date   CHOL 133 08/18/2019   TRIG 58.0 08/18/2019   HDL 51.20 08/18/2019   CHOLHDL 3 08/18/2019   VLDL 11.6 08/18/2019   LDLCALC 70 08/18/2019   LDLCALC 73 01/31/2018    Physical Findings: AIMS: No  CIWA:    Not assessed  COWS:    Not assessed  Psychiatric Specialty Exam: General Appearance:  Appropriate for Environment; Casual   Eye Contact:  Good   Speech:  Normal Rate   Volume:  Normal   Mood:  Dysphoric   Affect:  Tearful; Depressed; Congruent  Thought Content:  Logical; Rumination   Suicidal Thoughts:  Suicidal Thoughts: No   Homicidal Thoughts:  Homicidal Thoughts: No   Thought Process:  Coherent; Goal Directed; Linear   Orientation:  Full (Time, Place and Person)     Memory:  Immediate Good   Judgment:  Fair   Insight:  Fair   Concentration:  Good   Recall:  Fair   Fund of Knowledge:  Fair   Language:  Fair   Psychomotor Activity:  Psychomotor Activity: Normal   Assets:  Manufacturing systems engineer; Desire for Improvement; Financial Resources/Insurance; Social Support; Housing; Intimacy   Sleep:  Sleep: Poor Number of Hours of Sleep: 7.75    Review of Systems Review of Systems  Psychiatric/Behavioral:  Negative for depression, hallucinations and suicidal ideas. The patient is not nervous/anxious.     Vital Signs: Blood pressure 127/86, pulse 68, temperature 98.3 F (36.8 C), temperature source Oral, resp. rate 16, height 5' 8.5"  (1.74 m), weight 110.2 kg, last menstrual period 08/22/2023, SpO2 100%. Body mass index is 36.41 kg/m. Physical Exam Constitutional:      Appearance: Normal appearance. He is obese.  Pulmonary:     Effort: Pulmonary effort is normal.  Musculoskeletal:        General: Normal range of motion.  Neurological:     Mental Status: He is alert and oriented to person, place, and time.    Assets  Assets: Manufacturing systems engineer; Desire for Improvement; Financial Resources/Insurance; Social Support; Housing; Intimacy   Treatment Plan Summary: Daily contact with patient to assess and evaluate symptoms and progress in treatment and Medication management  Diagnoses / Active Problems: MDD (major depressive disorder) Principal Problem:   MDD (major depressive disorder)   ASSESSMENT:  Theodore Scott is a 42 y.o., adult transgendered male to male with a past psychiatric history of Anxiety who presents to the [facility]voluntary from [facility] Emergency Department for evaluation and symptom stabilization for suicidal ideations and rule out of major depressive disorder. Upon assessment, today he denies any active SI/HI/AVH. Continues to ruminate about home life and stressors. Patient seems to be improving and becoming more interactive with group sessions. Will rescind IVC and patient amenable to sign voluntarily. Reassured that wife feels, safe allowing [place] home once meeting all discharge goals and has attempted to establish therapy sessions upon discharge. Patient started Wellbutrin today and will monitor for worsening anxiety or other side effects. Will consider trial of Buspar and consider if patient amenable. Anticipate possible discharge tomorrow if continues to stabilize.    PLAN: Safety and Monitoring:             -- Involuntary admission to inpatient psychiatric unit for safety, stabilization and treatment             -- Daily contact with patient to assess and evaluate  symptoms and progress in treatment             -- Patient's case to be discussed in multi-disciplinary team meeting             -- Observation Level : q15 minute checks             -- Vital signs: q12 hours             -- Precautions: suicide, elopement, and assault   2. Interventions (medications, psychoeducation, etc):               -- Medical regimen: Started Wellbutrin XR 150 mg daily, monitor  for medication tolerance or side effects   -- Can consider a scheduled anxiolytic such Buspar to help manage anxiety, will discuss with patient              -- Patient does not need nicotine replacement             -- Continue Home Synthroid 75 mcg daily   PRN medications for symptomatic management:              -- start acetaminophen 650 mg every 6 hours as needed for mild to moderate pain, fever, and headaches              -- start hydroxyzine 25 mg three times a day as needed for anxiety              -- start aluminum-magnesium hydroxide + simethicone 30 mL every 4 hours as needed for heartburn              -- start trazodone 50 mg at bedtime as needed for insomnia             -- As needed agitation protocol in-place   The risks/benefits/side-effects/alternatives to the above medication were discussed in detail with the patient and time was given for questions. The patient consents to medication trial. FDA black box warnings, if present, were discussed.   The patient is agreeable with the medication plan, as above. We will monitor the patient's response to pharmacologic treatment, and adjust medications as necessary.  3. Routine and other pertinent labs:             -- Metabolic profile:  BMI: Body mass index is 36.41 kg/m.  Prolactin: No results found for: "PROLACTIN"  Lipid Panel: Lab Results  Component Value Date   CHOL 133 08/18/2019   TRIG 58.0 08/18/2019   HDL 51.20 08/18/2019   CHOLHDL 3 08/18/2019   VLDL 11.6 08/18/2019   LDLCALC 70 08/18/2019   LDLCALC 73 01/31/2018     HbgA1c: Hgb A1c MFr Bld (%)  Date Value  01/31/2018 5.1    TSH: TSH (uIU/mL)  Date Value  10/21/2020 1.965  08/18/2019 1.97   EKG monitoring: QTc: 447  4. Group Therapy:    -- Encouraged patient to participate in unit milieu and in scheduled group therapies              -- Short Term Goals: Ability to identify changes in lifestyle to reduce recurrence of condition, verbalize feelings, identify and develop effective coping behaviors, maintain clinical measurements within normal limits, and identify triggers associated with substance abuse/mental health issues will improve. Improvement in ability to disclose and discuss suicidal ideas, demonstrate self-control, and comply with prescribed medications.             -- Long Term Goals: Improvement in symptoms so as ready for discharge -- Patient is encouraged to participate in group therapy while admitted to the psychiatric unit. -- We will address other chronic and acute stressors, which contributed to the patient's MDD (major depressive disorder) in order to reduce the risk of self-harm at discharge.   5. Discharge Planning:              -- Social work and case management to assist with discharge planning and identification of hospital follow-up needs prior to discharge             -- Estimated LOS: Anticipate PM discharge tomorrow, pending continued symptomatic improvement              --  Discharge Concerns: Need to establish a safety plan; Medication compliance and effectiveness. Safety planning conducted with this provider as well as Child psychotherapist on 12/2 with wife Theodore Scott  -- Plans on having therapy sessions arranged at [facility]              -- Discharge Goals: Return home with outpatient referrals for mental health follow-up including medication  -- Will need to discuss outpatient psychiatric referral with primary care provider for medical management/psychotherapy  I certify that inpatient services furnished can reasonably be  expected to improve the patient's condition.   Signed: Peterson Ao, MD 08/31/2023, 2:48 PM

## 2023-09-01 MED ORDER — BUPROPION HCL ER (XL) 150 MG PO TB24: 150.0000 mg | ORAL_TABLET | Freq: Every day | ORAL | 0 refills | Status: AC

## 2023-09-01 MED ORDER — LEVOTHYROXINE SODIUM 75 MCG PO TABS: 75.0000 ug | ORAL_TABLET | Freq: Every day | ORAL | Status: AC

## 2023-09-01 MED ORDER — BUSPIRONE HCL 5 MG PO TABS: 5.0000 mg | ORAL_TABLET | Freq: Two times a day (BID) | ORAL | 0 refills | Status: AC

## 2023-09-01 NOTE — Discharge Instructions (Signed)
Activity: as tolerated  Diet: heart healthy  Other: -Request a referral with your PCP to establish with an outpatient psychiatric provider   -Take your psychiatric medications as prescribed at discharge such as: Continue Wellbutrin 150 mg daily and Buspar 5 mg twice daily, currently at low doses for both medications and can discuss increasing doses after assessment with primary provider or psychiatric provider if needed in the future   -Follow-up with outpatient primary care doctor and other specialists -for management of preventative medicine and chronic medical disease such hypothyroidism   -Testing: Follow-up with outpatient provider for abnormal lab results: None   -Recommend total abstinence from alcohol, tobacco, and other illicit drug use at discharge.   -If your psychiatric symptoms recur, worsen, or if you have side effects to your psychiatric medications, call your outpatient psychiatric provider, 911, [phone] or go to the nearest emergency department.  -If suicidal thoughts occur, immediately call your outpatient psychiatric provider, 911, [phone] or go to the nearest emergency department.

## 2023-09-01 NOTE — Progress Notes (Signed)
  [facility] Adult Case Management Discharge Plan :  Will you be returning to the same living situation after discharge:  Yes,  pt will be returning home to [place]  At discharge, do you have transportation home?: Yes,  Pt will be picked up by wife at 11:30AM Do you have the ability to pay for your medications: Yes,  Pt has health insurance and is employed   Release of information consent forms completed and in the chart;  Patient's signature needed at discharge.  Patient to Follow up at:  Follow-up Information     [facility]. Call.   Why: Please call your provider on Thursday 09/02/23 at 9:00 AM to schedule and/or confirm your next therapy appointment. Contact information: [address]  P: [phone]        Donney Dice, NP. Call.   Specialty: Nurse Practitioner Why: Please call your provider on Thursday 08/03/23 at 11:00AM to schedule and/or confirm an appointment for medication management services. Contact information: [address] [place] [address] [phone]                 Next level of care provider has access to [facility] Link:no  Safety Planning and Suicide Prevention discussed: Valentino Hue  Elrey Capozza, wife [phone]     Has patient been referred to the Quitline?: Patient does not use tobacco/nicotine products  Patient has been referred for addiction treatment: No known substance use disorder.  Kathi Der, LCSWA 09/01/2023, 9:28 AM

## 2023-09-01 NOTE — Progress Notes (Signed)
Discharge Note:  Patient discharged home with family member.  Patient denied SI and HI. Denied A/V hallucinations. Suicide prevention information given and discussed with patient who stated they understood and had no questions. Patient stated they received all their belongings, clothing, toiletries, misc items, etc. Patient stated they appreciated all assistance received from [facility] staff. All required discharge information given to patient. 

## 2023-09-01 NOTE — Progress Notes (Signed)
   09/01/23 0557  15 Minute Checks  Location Bedroom  Visual Appearance Calm  Behavior Sleeping  Sleep (Behavioral Health Patients Only)  Calculate sleep? (Click Yes once per 24 hr at 0600 safety check) Yes  Documented sleep last 24 hours 8.25

## 2023-09-01 NOTE — Progress Notes (Addendum)
[facility] Discharge Suicide Risk Assessment  Principal Problem: MDD (major depressive disorder) Discharge Diagnoses: Principal Problem:   MDD (major depressive disorder) Active Problems:   Generalized anxiety disorder  Reason for Admission:   Theodore [facility] is a 42 y.o., adult transgendered male to male with a past psychiatric history of generalized anxiety who presents to [facility] for evaluation and symptom stabilization for suicidal ideations and rule out of major depressive disorder.    Chart review: On chart review, prior to this evaluation, patient was evaluated via tele psychiatry a the [facility]. Patient reportedly has been dealing with intrusive thoughts to harm herself. Symptoms gradually worsening as life stressors with graduate school and marital problems occurred. Patient has felt impulsive, increased irritability and low frustration tolerance. She hoped to leave voluntarily and establish with therapy resources outpatient. Follow-up with her primary provider Theodore Buttner, NP in [place] to place a psychiatry referral. The patient was IVC'ed in the setting of suicidal ideations.    On interview today, patient denies any feelings of low mood, lost of interests, anhedonia, guilt, appetite or psychomotor retardation. Reports that she became overwhelmed due to everything happening. Has had thoughts previously, however no intention or active plan.    Denies any episodes of elevated mood in the setting of decreased sleep, pressured speech, grandiose thinking, impulsivity or risky behaviors. Denies SI/HI/AVH. Denies social anxiety, agoraphobia or panic attacks. Denies history of verbal, sexual or physical abuse. Most traumatic things in his life occurred when his parents got divorced (3 y.o) and transitioning from male to male.    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 Has always tried attempted to mask his feelings, battled with cognitive distortions surrounding friendships, family support or romantic relationships.    Attempted group sessions today and realized that she also is a catastrophist and has unrealistic expectations at times. Previously diagnosed with anxiety and tried Lexapro x2, however noted increased restlessness on medications and discontinued.  Feels anxiety diagnosis was incorrect. Patient reports that she feels supported by his wife and family members from both sides. Patient is amenable with trying medications and me discussing safety planning with his wife [facility].    Past Psychiatric History:    Previous psych diagnoses:  Anxiety, ADHD (self reported per patient after testing),   Prior inpatient psychiatric treatment: Denies Prior outpatient psychiatric treatment: Denies Current psychiatric provider: Denies   Neuromodulation history: denies   Current therapist:  Denies, attempting to establish with providers back [place].  Psychotherapy hx: Denies   History of suicide attempts: Denies History of homicide: Denies   Psychotropic medications: Current Denies - Not prescribed any psychiatric medications at the moment   Past Escitalopram - patient reports intolerable side-effect, namely increased restlessness feeling.   Substance Use History: Alcohol: never drinks Hx withdrawal tremors/shakes: denies Hx alcohol related blackouts: denies Hx alcohol induced hallucinations: denies Hx alcoholic seizures: denies Hx medical hospitalization due to severe alcohol withdrawal symptoms: denies DUI: denies   --------   Tobacco: never smoked, vaped, or chewed tobacco Cannabis (marijuana): never tried Cocaine: never  tried Methamphetamines: never tried Psilocybin (mushrooms): never tried Ecstasy (MDMA / molly): never tried LSD (acid): never tried Opiates (fentanyl / heroin): never tried Benzos (Xanax, Klonopin): never tried  IV drug use: denies Prescribed meds abuse: denies   History of detox: denies History of rehab: denies  [facility] Summary  During the patient's hospitalization, patient had extensive initial psychiatric evaluation, and follow-up psychiatric evaluations every day.   Psychiatric diagnoses provided upon initial assessment: Major Depressive Disorder, Generalized Anxiety Disorder    Patient's psychiatric medications were adjusted on admission: None    During the hospitalization, other adjustments were made to the patient's psychiatric medication regimen:    -Started on Wellbutrin 150 mg daily major depression disorder  -Started on Buspar 5 mg BID daily for anxiety    Patient's care was discussed during the interdisciplinary team meeting every day during the hospitalization.   The patient denied having side effects to prescribed psychiatric medication.   Gradually, patient started adjusting to milieu. The patient was evaluated each day by a clinical provider to ascertain response to treatment. Improvement was noted by the patient's report of decreasing symptoms, improved sleep and appetite, affect, medication tolerance, behavior, and participation in unit programming.  Patient was asked each day to complete a self inventory noting mood, mental status, pain, new symptoms, anxiety and concerns. Patients IVC was not upheld during admission and the patient signed in for voluntary treatment.      Symptoms were reported as significantly decreased or resolved completely by discharge.    On day of discharge, the patient reports that their mood is stable. The patient denied having suicidal thoughts for more than 48 hours prior to discharge.  Patient denies having homicidal thoughts.  Patient  denies having auditory hallucinations.  Patient denies any visual hallucinations or other symptoms of psychosis. The patient was motivated to continue taking medication with a goal of continued improvement in mental health.    The patient reports their target psychiatric symptoms of negative ruminations and cognitive distortions responded well to the psychiatric medications, and the patient reports overall benefit other psychiatric hospitalization. Supportive psychotherapy was provided to the patient. The patient also participated in regular group therapy while hospitalized. Coping skills, problem solving as well as relaxation therapies were also part of the unit programming.   Labs were reviewed with the patient, and abnormal results were discussed with the patient.   The patient is able to verbalize their individual safety plan to this provider.   # It is recommended to the patient to continue psychiatric medications as prescribed, after discharge from the [facility].     # It is recommended to the patient to follow up with your PCP and establish with therapy services and with a psychiatric provider for medication management.   # It was discussed with the patient, the impact of alcohol, drugs, tobacco have been there overall psychiatric and medical wellbeing, and total abstinence from substance use was recommended the patient.ed.   # Prescriptions provided or sent directly to preferred pharmacy at discharge. Patient agreeable to plan. Given opportunity to ask questions. Appears to feel comfortable with discharge.    # In the event of worsening symptoms, the patient is instructed to call the crisis hotline, 911 and or go to the nearest ED for appropriate evaluation and treatment of symptoms. To follow-up with primary care provider for other medical issues, concerns and or health care needs   # Patient was discharged on 09/01/2023 home with her wife Rorik Crummie, with a plan to follow up as noted  below.   Total Time spent with patient: 30 minutes  Musculoskeletal: Strength & Muscle Tone: within normal limits Gait & Station: normal  Patient leans: N/A  Psychiatric Specialty Exam  Presentation  General Appearance: Appropriate for Environment; Casual  Eye Contact:Good  Speech:Clear and Coherent  Speech Volume:Normal  Handedness:No data recorded  Mood and Affect  Mood:Euthymic; Anxious  Duration of Depression Symptoms: No data recorded Affect:Congruent; Full Range; Appropriate   Thought Process  Thought Processes:Coherent; Goal Directed; Linear  Descriptions of Associations:Intact  Orientation:Full (Time, Place and Person)  Thought Content:Logical  History of Schizophrenia/Schizoaffective disorder:None  Duration of Psychotic Symptoms:None  Hallucinations:Hallucinations: None  Ideas of Reference:None  Suicidal Thoughts:Suicidal Thoughts: No  Homicidal Thoughts:Homicidal Thoughts: No   Sensorium  Memory:Immediate Good  Judgment:Good  Insight:Good   Executive Functions  Concentration:Good  Attention Span:Good  Recall:Good  Fund of Knowledge:Good  Language:Good   Psychomotor Activity  Psychomotor Activity:Psychomotor Activity: Normal   Assets  Assets:Financial Resources/Insurance; Manufacturing systems engineer; Desire for Improvement; Intimacy; Housing; Agricultural engineer; Vocational/Educational   Sleep  Sleep:Sleep: Good Number of Hours of Sleep: 8.25   Physical Exam: Physical Exam Constitutional:      Appearance: Normal appearance. He is obese.  Pulmonary:     Effort: Pulmonary effort is normal.  Musculoskeletal:        General: Normal range of motion.  Neurological:     Mental Status: He is alert and oriented to person, place, and time.  Psychiatric:        Attention and Perception: Attention and perception normal. He does not perceive auditory or visual hallucinations.        Mood and Affect: Mood is anxious. Mood is  not depressed. Affect is not flat.        Speech: Speech normal.        Behavior: Behavior normal. Behavior is cooperative.        Thought Content: Thought content is not paranoid or delusional. Thought content does not include homicidal or suicidal ideation.    Review of Systems  Constitutional:  Negative for chills and fever.  Respiratory:  Negative for cough.   Cardiovascular:  Negative for chest pain.  Gastrointestinal:  Negative for nausea and vomiting.  Neurological:  Negative for headaches.  Psychiatric/Behavioral:  Negative for depression, hallucinations, substance abuse and suicidal ideas. The patient is nervous/anxious.    Blood pressure 104/71, pulse 80, temperature 98.3 F (36.8 C), temperature source Oral, resp. rate 18, height 5' 8.5" (1.74 m), weight 110.2 kg, last menstrual period 08/22/2023, SpO2 100%. Body mass index is 36.41 kg/m.  Mental Status Per Nursing Assessment::   On Admission:  Suicidal ideation indicated by patient, Self-harm thoughts  Demographic Factors:  Male, Caucasian, and transgered  Loss Factors: NA  Historical Factors: Family history of mental illness or substance abuse and Impulsivity  Risk Reduction Factors:   Responsible for children under 2 years of age, Sense of responsibility to family, Religious beliefs about death, Living with another person, especially a relative, and Positive social support  Continued Clinical Symptoms:  Severe Anxiety and/or Agitation Depression:   Impulsivity  Cognitive Features That Contribute To Risk:  None    Suicide Risk:  Mild: There are no identifiable suicide plans, no associated intent, mild dysphoria and related symptoms, good self-control (both objective and subjective assessment), few other risk factors, and identifiable protective factors, including available and accessible social support.   Follow-up Information     [facility]. Call.   Why: Please call your provider on Thursday  09/02/23 at 9:00 AM to schedule and/or confirm your next therapy appointment. Contact information: [address]  P: [phone]  Donney Dice, NP. Call.   Specialty: Nurse Practitioner Why: Please call your provider on Thursday 08/03/23 at 11:00AM to schedule and/or confirm an appointment for medication management services. Contact information: [address] [place] [address] [phone]                Plan Of Care/Follow-up recommendations:  Activity: as tolerated   Diet: heart healthy   Other:  -Request a referral with your PCP to establish with an outpatient psychiatric provider    -Take your psychiatric medications as prescribed at discharge - instructions are provided to you in the discharge paperwork - Continue Wellbutrin 150 mg daily  - Continue Buspar 5 mg twice daily    -Follow-up with outpatient primary care doctor and other specialists -for management of preventative medicine and chronic medical disease such hypothyroidism    -Testing: Follow-up with outpatient provider for abnormal lab results: None    -Recommend total abstinence from alcohol, tobacco, and other illicit drug use at discharge.    -If your psychiatric symptoms recur, worsen, or if you have side effects to your psychiatric medications, call your outpatient psychiatric provider, 911, [phone] or go to the nearest emergency department.   -If suicidal thoughts occur, immediately call your outpatient psychiatric provider, 911, [phone] or go to the nearest emergency department.   Comments:     Thank you for allowing [place] to be a part of your care.   Signed: Peterson Ao, MD 09/01/2023, 10:40 AM

## 2023-09-01 NOTE — Discharge Summary (Signed)
Physician Discharge Summary Note  Patient:  Theodore Scott is an 42 y.o., adult MRN:  [id number] DOB:  1980-11-22 Patient phone:  [phone] (home)  Patient address:   [address] [place] [address],  Total Time spent with patient: 30 minutes  Date of Admission:  08/30/2023 Date of Discharge: 09/01/2023  Reason for Admission:  Suicidal ideations   Principal Problem: MDD (major depressive disorder) Discharge Diagnoses: Principal Problem:   MDD (major depressive disorder) Active Problems:   Generalized anxiety disorder   History of Present Illness:  Theodore Scott is a 42 y.o., adult transgendered male to male with a past psychiatric history of generalized anxiety who presents to the [facility] Involuntary from [facility] Emergency Department for evaluation and symptom stabilization for suicidal ideations and rule out of major depressive disorder.    Chart review: On chart review, prior to this evaluation, patient was evaluated via tele psychiatry a the [facility] ED. Patient reportedly has been dealing with intrusive thoughts to harm herself. Symptoms gradually worsening as life stressors with graduate school and marital problems occurred. Patient has felt impulsive, increased irritability and low frustration tolerance. She hoped to leave voluntarily and establish with therapy resources outpatient. Follow-up with her primary provider Bufford Buttner, NP in [place] to place a psychiatry referral. The patient was IVC'ed in the setting of suicidal ideations.    On interview today, patient denies any feelings of low mood, lost of interests, anhedonia, guilt, appetite or psychomotor retardation. Reports that she became overwhelmed due to everything happening. Has had thoughts previously, however no intention or active plan.    Denies any episodes of elevated mood in the setting of decreased sleep, pressured speech, grandiose thinking, impulsivity or risky behaviors. Denies  SI/HI/AVH. Denies social anxiety, agoraphobia or panic attacks. Denies history of verbal, sexual or physical abuse. Most traumatic things in his life occurred when his parents got divorced (35 y.o) and transitioning from male to male.    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 Has always tried attempted to mask his feelings, battled with cognitive distortions surrounding friendships, family support or romantic relationships.    Attempted group sessions today and realized that she also is a catastrophist and has unrealistic expectations at times. Previously diagnosed with anxiety and tried Lexapro x2, however noted increased restlessness on medications and discontinued.  Feels anxiety diagnosis was incorrect. Patient reports that she feels supported by his wife and family members from both sides. Patient is amenable with trying medications and me discussing safety planning with [name].   Past Psychiatric History:   Previous psych diagnoses:  Anxiety, ADHD (self reported per patient after testing),   Prior inpatient psychiatric treatment: Denies Prior outpatient psychiatric treatment: Denies Current psychiatric provider: Denies   Neuromodulation history: denies   Current therapist:  Denies, attempting to establish with providers back [place].  Psychotherapy hx: Denies   History of suicide attempts: Denies History of homicide: Denies   Psychotropic medications: Current Denies - Not prescribed any psychiatric medications at the moment   Past Escitalopram - patient reports intolerable side-effect, namely increased restlessness feeling.   Substance Use History: Alcohol: never drinks Hx withdrawal tremors/shakes: denies Hx alcohol  related blackouts: denies Hx alcohol induced hallucinations: denies Hx alcoholic seizures: denies Hx medical  hospitalization due to severe alcohol withdrawal symptoms: denies DUI: denies   --------   Tobacco: never smoked, vaped, or chewed tobacco Cannabis (marijuana): never tried Cocaine: never tried Methamphetamines: never tried Psilocybin (mushrooms): never tried Ecstasy (MDMA / molly): never tried LSD (acid): never tried Opiates (fentanyl / heroin): never tried Benzos (Xanax, Klonopin): never tried IV drug use: denies Prescribed meds abuse: denies   History of detox: denies History of rehab: denies  Past Medical History:  Past Medical History:  Diagnosis Date   Anxiety    Arthritis    Depression    Ectopic heartbeat    Hypothyroidism    PCOS (polycystic ovarian syndrome)    Sleep apnea     Past Surgical History:  Procedure Laterality Date   CHOLECYSTECTOMY     MODIFIED BROSTROM PROCEDURE Right 2016   Family History:  Family History  Problem Relation Age of Onset   Hypertension Mother    Atrial fibrillation Mother    Depression Mother    Anxiety disorder Mother    Thyroid disease Mother    Sleep apnea Mother    Hyperlipidemia Father    Hypertension Father    Depression Father    Anxiety disorder Father    Diabetes Father    Sleep apnea Father    Bipolar disorder Sister    Anxiety disorder Sister    Thyroid disease Maternal Aunt    Diabetes Paternal Aunt    Diabetes Paternal Uncle    Heart attack Paternal Uncle    Lupus Maternal Grandmother    Thyroid disease Maternal Grandmother    Cancer Paternal Grandmother        breast   Diabetes Paternal Grandmother    Diabetes Paternal Grandfather    Family Psychiatric  History:  Psych: MDD (Dad) and Mother (unspecified psychiatric illness), Sister (Bipolar Disorder)  Psych Rx: Unsure  Suicide: Denies  Homicide: Denies  Substance use family hx: EtOH MGM   Social History:  Social History   Substance  and Sexual Activity  Alcohol Use Not Currently     Social History   Substance and Sexual Activity  Drug Use No    Social History   Socioeconomic History   Marital status: Married    Spouse name: Not on file   Number of children: Not on file   Years of education: Not on file   Highest education level: Not on file  Occupational History   Not on file  Tobacco Use   Smoking status: Never   Smokeless tobacco: Never  Vaping Use   Vaping status: Never Used  Substance and Sexual Activity   Alcohol use: Not Currently   Drug use: No   Sexual activity: Yes  Other Topics Concern   Not on file  Social History Narrative   Not on file   Social Determinants of Health   Financial Resource Strain: Not on file  Food Insecurity: No Food Insecurity (08/30/2023)   Hunger Vital Sign    Worried About Running Out of Food in the Last Year: Never true    Ran Out of Food in the Last Year: Never true  Transportation Needs: No Transportation Needs (08/30/2023)   PRAPARE - Administrator, Civil Service (Medical): No    Lack of Transportation (Non-Medical): No  Physical Activity: Not on file  Stress: Not on file  Social Connections: Low Risk  (09/02/2022)   Received from [facility]   Social Connections    How often do you  see or talk to people that you care about and feel close to? (For example: talking to friends on the phone, visiting friends or family, going to church or club meetings): 5 or more times a week   Hospital Course:    During the patient's hospitalization, patient had extensive initial psychiatric evaluation, and follow-up psychiatric evaluations every day.  Psychiatric diagnoses provided upon initial assessment: Major Depressive Disorder, Generalized Anxiety Disorder   Patient's psychiatric medications were adjusted on admission: None   During the hospitalization, other adjustments were made to the patient's psychiatric medication regimen:    -Started on Wellbutrin 150 mg daily major depression disorder  -Started on Buspar 5 mg BID daily for anxiety   Patient's care was discussed during the interdisciplinary team meeting every day during the hospitalization.  The patient denied having side effects to prescribed psychiatric medication.  Gradually, patient started adjusting to milieu. The patient was evaluated each day by a clinical provider to ascertain response to treatment. Improvement was noted by the patient's report of decreasing symptoms, improved sleep and appetite, affect, medication tolerance, behavior, and participation in unit programming.  Patient was asked each day to complete a self inventory noting mood, mental status, pain, new symptoms, anxiety and concerns. Patients IVC was not upheld during admission and the patient signed in for voluntary treatment.     Symptoms were reported as significantly decreased or resolved completely by discharge.   On day of discharge, the patient reports that their mood is stable. The patient denied having suicidal thoughts for more than 48 hours prior to discharge.  Patient denies having homicidal thoughts.  Patient denies having auditory hallucinations.  Patient denies any visual hallucinations or other symptoms of psychosis. The patient was motivated to continue taking medication with a goal of continued improvement in mental health.   The patient reports their target psychiatric symptoms of negative ruminations and cognitive distortions responded well to the psychiatric medications, and the patient reports overall benefit other psychiatric hospitalization. Supportive psychotherapy was provided to the patient. The patient also participated in regular group therapy while hospitalized. Coping skills, problem solving as well as relaxation therapies were also part of the unit programming.  Labs were reviewed with the patient, and abnormal results were discussed with the patient.  The  patient is able to verbalize their individual safety plan to this provider.  # It is recommended to the patient to continue psychiatric medications as prescribed, after discharge from the hospital.    # It is recommended to the patient to follow up with your PCP and establish with therapy services and with a psychiatric provider for medication management.  # It was discussed with the patient, the impact of alcohol, drugs, tobacco have been there overall psychiatric and medical wellbeing, and total abstinence from substance use was recommended the patient.ed.  # Prescriptions provided or sent directly to preferred pharmacy at discharge. Patient agreeable to plan. Given opportunity to ask questions. Appears to feel comfortable with discharge.    # In the event of worsening symptoms, the patient is instructed to call the crisis hotline, 911 and or go to the nearest ED for appropriate evaluation and treatment of symptoms. To follow-up with primary care provider for other medical issues, concerns and or health care needs  # Patient was discharged on 09/01/2023 home with her wife Jamor Ajayi, with a plan to follow up as noted below.   Physical Findings: AIMS:  , ,  ,  ,   Negative  CIWA:  Negative  COWS:    Negative   Musculoskeletal: Strength & Muscle Tone: within normal limits Gait & Station: normal Patient leans: N/A  Psychiatric Specialty Exam  Presentation  General Appearance: Appropriate for Environment; Casual  Eye Contact:Good  Speech:Clear and Coherent  Speech Volume:Normal  Handedness:No data recorded  Mood and Affect  Mood:Euthymic, intermittently anxious   Duration of Depression Symptoms: No data recorded Affect:Congruent; Full Range; Appropriate   Thought Process  Thought Processes:Coherent; Goal Directed; Linear  Descriptions of Associations:Intact  Orientation:Full (Time, Place and Person)  Thought Content:Logical  History of  Schizophrenia/Schizoaffective disorder:None  Duration of Psychotic Symptoms:None  Hallucinations:Hallucinations: None  Ideas of Reference:None  Suicidal Thoughts:Suicidal Thoughts: No  Homicidal Thoughts:Homicidal Thoughts: No   Sensorium  Memory:Immediate Good  Judgment:Good  Insight:Good   Executive Functions  Concentration:Good  Attention Span:Good  Recall:Good  Fund of Knowledge:Good  Language:Good   Psychomotor Activity  Psychomotor Activity:Psychomotor Activity: Normal   Assets  Assets:Financial Resources/Insurance; Manufacturing systems engineer; Desire for Improvement; Intimacy; Housing; Agricultural engineer; Vocational/Educational   Sleep  Sleep:Sleep: Good Number of Hours of Sleep: 8.25  Physical Exam: Physical Exam Constitutional:      Appearance: Normal appearance. He is obese.  Pulmonary:     Effort: Pulmonary effort is normal.  Neurological:     Mental Status: He is alert and oriented to person, place, and time.  Psychiatric:        Mood and Affect: Mood normal.        Behavior: Behavior normal.    Review of Systems  Constitutional:  Negative for chills and fever.  Respiratory:  Negative for cough and shortness of breath.   Cardiovascular:  Negative for chest pain.  Gastrointestinal:  Negative for nausea and vomiting.  Neurological:  Negative for headaches.  Psychiatric/Behavioral:  Negative for depression, hallucinations and suicidal ideas. The patient is nervous/anxious.    Blood pressure 104/71, pulse 80, temperature 98.3 F (36.8 C), temperature source Oral, resp. rate 18, height 5' 8.5" (1.74 m), weight 110.2 kg, last menstrual period 08/22/2023, SpO2 100%. Body mass index is 36.41 kg/m.   Social History   Tobacco Use  Smoking Status Never  Smokeless Tobacco Never   Tobacco Cessation:  N/A, patient does not currently use tobacco products   Blood Alcohol level:  Lab Results  Component Value Date   ETH <10 08/29/2023     Metabolic Disorder Labs:  Lab Results  Component Value Date   HGBA1C 5.1 01/31/2018   No results found for: "PROLACTIN" Lab Results  Component Value Date   CHOL 133 08/18/2019   TRIG 58.0 08/18/2019   HDL 51.20 08/18/2019   CHOLHDL 3 08/18/2019   VLDL 11.6 08/18/2019   LDLCALC 70 08/18/2019   LDLCALC 73 01/31/2018    See Psychiatric Specialty Exam and Suicide Risk Assessment completed by Attending Physician prior to discharge.  Discharge destination:  Home  Is patient on multiple antipsychotic therapies at discharge:  No   Has Patient had three or more failed trials of antipsychotic monotherapy by history:  No  Recommended Plan for Multiple Antipsychotic Therapies: NA patient not currently on any antipsychotic medications.    Allergies as of 09/01/2023       Reactions   Vicodin [hydrocodone-acetaminophen] Other (See Comments)   Pt does not like how she feels "shaky, panicky" sensation        Medication List     TAKE these medications      Indication  acetaminophen 500 MG tablet Commonly known as: TYLENOL  Take 1,000 mg by mouth every 6 (six) hours as needed for mild pain or headache.  Indication: Pain   buPROPion 150 MG 24 hr tablet Commonly known as: WELLBUTRIN XL Take 1 tablet (150 mg total) by mouth daily.  Indication: Major Depressive Disorder   busPIRone 5 MG tablet Commonly known as: BUSPAR Take 1 tablet (5 mg total) by mouth 2 (two) times daily.  Indication: Anxiety Disorder   ibuprofen 200 MG tablet Commonly known as: ADVIL Take 400-600 mg by mouth every 8 (eight) hours as needed (for pain.).  Indication: Headache   levothyroxine 75 MCG tablet Commonly known as: SYNTHROID Take 1 tablet (75 mcg total) by mouth daily at 6 (six) AM. Start taking on: September 02, 2023 What changed:  how much to take how to take this when to take this additional instructions  Indication: Underactive Thyroid         Follow-up Information      [facility]. Call.   Why: Please call your provider to schedule and/or confirm your next therapy appointment. Contact information: [address]  P: [phone]        Donney Dice, NP. Call.   Specialty: Nurse Practitioner Why: Please call your provider to schedule and/or confirm an appointment for medication management services. Contact information: [address] [place] [address] [phone]                 Follow-up recommendations:    Activity: as tolerated  Diet: heart healthy  Other: -Request a referral with your PCP to establish with an outpatient psychiatric provider   -Take your psychiatric medications as prescribed at discharge - instructions are provided to you in the discharge paperwork  -Follow-up with outpatient primary care doctor and other specialists -for management of preventative medicine and chronic medical disease such hypothyroidism   -Testing: Follow-up with outpatient provider for abnormal lab results: None   -Recommend total abstinence from alcohol, tobacco, and other illicit drug use at discharge.   -If your psychiatric symptoms recur, worsen, or if you have side effects to your psychiatric medications, call your outpatient psychiatric provider, 911, [phone] or go to the nearest emergency department.  -If suicidal thoughts occur, immediately call your outpatient psychiatric provider, 911, [phone] or go to the nearest emergency department.  Comments:    Thank you for allowing [place] to be a part of your care.   Signed: Dr. Peterson Ao, MD PGY-1, Psychiatry Residency  09/01/2023, 9:18 AM
